# Patient Record
Sex: Male | Born: 1975 | Race: Black or African American | Hispanic: No | Marital: Single | Smoking: Current every day smoker
Health system: Southern US, Community
[De-identification: ages and names within clinical notes are randomized; demographics above are authoritative.]

## PROBLEM LIST (undated history)

## (undated) DIAGNOSIS — F1910 Other psychoactive substance abuse, uncomplicated: Secondary | ICD-10-CM

---

## 2002-01-17 ENCOUNTER — Emergency Department (HOSPITAL_COMMUNITY): Admission: EM | Admit: 2002-01-17 | Discharge: 2002-01-17 | Payer: Self-pay | Admitting: Emergency Medicine

## 2002-02-19 ENCOUNTER — Emergency Department (HOSPITAL_COMMUNITY): Admission: EM | Admit: 2002-02-19 | Discharge: 2002-02-19 | Payer: Self-pay | Admitting: *Deleted

## 2004-01-10 ENCOUNTER — Emergency Department (HOSPITAL_COMMUNITY): Admission: EM | Admit: 2004-01-10 | Discharge: 2004-01-10 | Payer: Self-pay | Admitting: Emergency Medicine

## 2004-02-05 ENCOUNTER — Emergency Department (HOSPITAL_COMMUNITY): Admission: EM | Admit: 2004-02-05 | Discharge: 2004-02-06 | Payer: Self-pay | Admitting: Emergency Medicine

## 2004-02-29 ENCOUNTER — Ambulatory Visit: Payer: Self-pay | Admitting: Internal Medicine

## 2004-02-29 ENCOUNTER — Inpatient Hospital Stay (HOSPITAL_COMMUNITY): Admission: EM | Admit: 2004-02-29 | Discharge: 2004-03-03 | Payer: Self-pay | Admitting: Emergency Medicine

## 2004-03-10 ENCOUNTER — Ambulatory Visit: Payer: Self-pay | Admitting: Internal Medicine

## 2018-07-11 ENCOUNTER — Encounter: Payer: Self-pay | Admitting: Critical Care Medicine

## 2018-07-12 ENCOUNTER — Encounter (INDEPENDENT_AMBULATORY_CARE_PROVIDER_SITE_OTHER): Payer: Self-pay | Admitting: Critical Care Medicine

## 2018-07-12 NOTE — Progress Notes (Signed)
42 [facility]  Notes 24hrs of chills, aches, no fever, head and throat pain.  Sl cough and sneeze.  Mucus is sl green.  Notes mild sinus congestion.   No flu shot.  Smoker.  Last saw MA Placey 3 mo ago.  NKDA.  No reg meds  No Dx   Px: Bp 130/82  T99.7   spo2 96%  Gen:  well-nourished, in no distress,  Flat  affect  ENT: No lesions,  mouth clear,  oropharynx clear, 1_+ postnasal drip, mild post pharyngeal inflammation  Neck: No JVD, no TMG, no carotid bruits  Lungs: No use of accessory muscles, no dullness to percussion, clear without rales or rhonchi  Cardiovascular: RRR, heart sounds normal, no murmur or gallops, no peripheral edema  Abdomen: soft and NT, no HSM,  BS normal  Musculoskeletal: No deformities, no cyanosis or clubbing  Neuro: alert, non focal  Skin: Warm, no lesions or rashes  No results found.   Imp  1. Doubt influenza.  Suspect Viral bronchitis.  No ABX or tamiflu needed.  Rx ibuprofen 400mg  qid prn symptoms  [facility] or [facility]u.

## 2019-07-12 ENCOUNTER — Other Ambulatory Visit: Payer: Self-pay | Admitting: *Deleted

## 2019-07-12 DIAGNOSIS — Z20822 Contact with and (suspected) exposure to covid-19: Secondary | ICD-10-CM

## 2019-07-14 LAB — NOVEL CORONAVIRUS, NAA: SARS-CoV-2, NAA: NOT DETECTED

## 2019-08-02 ENCOUNTER — Other Ambulatory Visit: Payer: Self-pay

## 2019-08-02 DIAGNOSIS — Z20822 Contact with and (suspected) exposure to covid-19: Secondary | ICD-10-CM

## 2019-08-03 LAB — NOVEL CORONAVIRUS, NAA: SARS-CoV-2, NAA: NOT DETECTED

## 2019-09-06 ENCOUNTER — Other Ambulatory Visit: Payer: Self-pay | Admitting: *Deleted

## 2019-09-06 DIAGNOSIS — Z20822 Contact with and (suspected) exposure to covid-19: Secondary | ICD-10-CM

## 2019-09-07 LAB — NOVEL CORONAVIRUS, NAA: SARS-CoV-2, NAA: NOT DETECTED

## 2019-12-07 ENCOUNTER — Observation Stay (HOSPITAL_COMMUNITY)
Admission: EM | Admit: 2019-12-07 | Discharge: 2019-12-08 | Disposition: A | Discharge status: Home / Self Care | Payer: Self-pay | Attending: Internal Medicine | Admitting: Internal Medicine

## 2019-12-07 ENCOUNTER — Emergency Department (HOSPITAL_COMMUNITY): Payer: Self-pay

## 2019-12-07 DIAGNOSIS — R490 Dysphonia: Secondary | ICD-10-CM | POA: Insufficient documentation

## 2019-12-07 DIAGNOSIS — T405X1A Poisoning by cocaine, accidental (unintentional), initial encounter: Principal | ICD-10-CM | POA: Insufficient documentation

## 2019-12-07 DIAGNOSIS — F1210 Cannabis abuse, uncomplicated: Secondary | ICD-10-CM | POA: Insufficient documentation

## 2019-12-07 DIAGNOSIS — J9690 Respiratory failure, unspecified, unspecified whether with hypoxia or hypercapnia: Secondary | ICD-10-CM

## 2019-12-07 DIAGNOSIS — G92 Toxic encephalopathy: Secondary | ICD-10-CM | POA: Insufficient documentation

## 2019-12-07 DIAGNOSIS — J690 Pneumonitis due to inhalation of food and vomit: Secondary | ICD-10-CM | POA: Diagnosis present

## 2019-12-07 DIAGNOSIS — T401X1A Poisoning by heroin, accidental (unintentional), initial encounter: Secondary | ICD-10-CM

## 2019-12-07 DIAGNOSIS — Z20822 Contact with and (suspected) exposure to covid-19: Secondary | ICD-10-CM | POA: Insufficient documentation

## 2019-12-07 DIAGNOSIS — T40601A Poisoning by unspecified narcotics, accidental (unintentional), initial encounter: Secondary | ICD-10-CM | POA: Insufficient documentation

## 2019-12-07 DIAGNOSIS — Z87891 Personal history of nicotine dependence: Secondary | ICD-10-CM | POA: Insufficient documentation

## 2019-12-07 DIAGNOSIS — F1410 Cocaine abuse, uncomplicated: Secondary | ICD-10-CM

## 2019-12-07 DIAGNOSIS — J9601 Acute respiratory failure with hypoxia: Secondary | ICD-10-CM | POA: Insufficient documentation

## 2019-12-07 DIAGNOSIS — Z720 Tobacco use: Secondary | ICD-10-CM

## 2019-12-07 DIAGNOSIS — Z66 Do not resuscitate: Secondary | ICD-10-CM | POA: Insufficient documentation

## 2019-12-07 DIAGNOSIS — F1010 Alcohol abuse, uncomplicated: Secondary | ICD-10-CM

## 2019-12-07 HISTORY — DX: Other psychoactive substance abuse, uncomplicated: F19.10

## 2019-12-07 LAB — CBC WITH DIFFERENTIAL/PLATELET
Abs Immature Granulocytes: 0.02 10*3/uL (ref 0.00–0.07)
Basophils Absolute: 0.1 10*3/uL (ref 0.0–0.1)
Basophils Relative: 1 %
Eosinophils Absolute: 0 10*3/uL (ref 0.0–0.5)
Eosinophils Relative: 0 %
HCT: 52.7 % — ABNORMAL HIGH (ref 39.0–52.0)
Hemoglobin: 17 g/dL (ref 13.0–17.0)
Immature Granulocytes: 0 %
Lymphocytes Relative: 12 %
Lymphs Abs: 1.5 10*3/uL (ref 0.7–4.0)
MCH: 33.3 pg (ref 26.0–34.0)
MCHC: 32.3 g/dL (ref 30.0–36.0)
MCV: 103.3 fL — ABNORMAL HIGH (ref 80.0–100.0)
Monocytes Absolute: 0.4 10*3/uL (ref 0.1–1.0)
Monocytes Relative: 3 %
Neutro Abs: 10.5 10*3/uL — ABNORMAL HIGH (ref 1.7–7.7)
Neutrophils Relative %: 84 %
Platelets: 220 10*3/uL (ref 150–400)
RBC: 5.1 MIL/uL (ref 4.22–5.81)
RDW: 11.9 % (ref 11.5–15.5)
WBC: 12.5 10*3/uL — ABNORMAL HIGH (ref 4.0–10.5)
nRBC: 0 % (ref 0.0–0.2)

## 2019-12-07 LAB — BASIC METABOLIC PANEL
Anion gap: 15 (ref 5–15)
BUN: 7 mg/dL (ref 6–20)
CO2: 18 mmol/L — ABNORMAL LOW (ref 22–32)
Calcium: 8.6 mg/dL — ABNORMAL LOW (ref 8.9–10.3)
Chloride: 108 mmol/L (ref 98–111)
Creatinine, Ser: 0.88 mg/dL (ref 0.61–1.24)
GFR calc Af Amer: >60 mL/min (ref >60)
GFR calc non Af Amer: >60 mL/min (ref >60)
Glucose, Bld: 106 mg/dL — ABNORMAL HIGH (ref 70–99)
Potassium: 3.6 mmol/L (ref 3.5–5.1)
Sodium: 141 mmol/L (ref 135–145)

## 2019-12-07 LAB — SARS CORONAVIRUS 2 BY RT PCR (HOSPITAL ORDER, PERFORMED IN ~~LOC~~ HOSPITAL LAB): SARS Coronavirus 2: NEGATIVE

## 2019-12-07 MED ORDER — FOLIC ACID 1 MG PO TABS: 1.0000 mg | ORAL_TABLET | Freq: Every day | ORAL | Status: DC

## 2019-12-07 MED ORDER — SODIUM CHLORIDE 0.9 % IV BOLUS
1000.0000 mL | Freq: Once | INTRAVENOUS | Status: AC
  Administered 2019-12-07: 1000 mL via INTRAVENOUS

## 2019-12-07 MED ORDER — NALOXONE HCL 0.4 MG/ML IJ SOLN
INTRAMUSCULAR | Status: AC
  Filled 2019-12-07: qty 1

## 2019-12-07 MED ORDER — ENOXAPARIN SODIUM 40 MG/0.4ML ~~LOC~~ SOLN
40.0000 mg | Freq: Every day | SUBCUTANEOUS | Status: DC
  Administered 2019-12-08: 40 mg via SUBCUTANEOUS
  Filled 2019-12-07: qty 0.4

## 2019-12-07 MED ORDER — ONDANSETRON HCL 4 MG/2ML IJ SOLN: 4.0000 mg | Freq: Three times a day (TID) | INTRAMUSCULAR | Status: DC | PRN

## 2019-12-07 MED ORDER — AMOXICILLIN-POT CLAVULANATE 875-125 MG PO TABS: 1.0000 | ORAL_TABLET | Freq: Two times a day (BID) | ORAL | 0 refills | Status: DC

## 2019-12-07 MED ORDER — SODIUM CHLORIDE 0.9 % IV SOLN
3.0000 g | Freq: Four times a day (QID) | INTRAVENOUS | Status: DC
  Administered 2019-12-08 (×2): 3 g via INTRAVENOUS
  Filled 2019-12-07 (×2): qty 3
  Filled 2019-12-07 (×2): qty 8

## 2019-12-07 MED ORDER — THIAMINE HCL 100 MG PO TABS: 100.0000 mg | ORAL_TABLET | Freq: Every day | ORAL | Status: DC

## 2019-12-07 MED ORDER — THIAMINE HCL 100 MG/ML IJ SOLN
Freq: Once | INTRAVENOUS | Status: AC
  Filled 2019-12-07: qty 1000

## 2019-12-07 MED ORDER — ONDANSETRON HCL 4 MG/2ML IJ SOLN
4.0000 mg | Freq: Once | INTRAMUSCULAR | Status: AC
  Administered 2019-12-07: 4 mg via INTRAVENOUS
  Filled 2019-12-07: qty 2

## 2019-12-07 MED ORDER — SODIUM CHLORIDE 0.9 % IV SOLN
3.0000 g | Freq: Once | INTRAVENOUS | Status: AC
  Administered 2019-12-07: 3 g via INTRAVENOUS
  Filled 2019-12-07: qty 8

## 2019-12-07 MED ORDER — AMOXICILLIN-POT CLAVULANATE 875-125 MG PO TABS
1.0000 | ORAL_TABLET | Freq: Once | ORAL | Status: DC
  Filled 2019-12-07: qty 1

## 2019-12-07 NOTE — H&P (Signed)
History and Physical    Juan Hood RCV:[id number] DOB: 07-27-1975 DOA: 12/07/2019  PCP: Patient, No Pcp Per  Patient coming from: [facility] ED (he was found down in the grass near a parking lot)  I have personally briefly reviewed patient's old medical records in [facility]  Chief Complaint: Found down in the grass near a parking lot  HPI: Juan Hood is a 44 y.o. male with medical history significant of polysubstance abuse was brought to the ED by EMS.  He was found down in the grass near parking lot.  Per documentation he admitted to snorting heroin and was found to be diaphoretic with pinpoint pupils and agonal respirations.  His mentation improved with 1 mg intranasal Narcan.  While in the ED, he was placed on 4 L oxygen via nasal cannula and while ambulating his oxygen saturations dropped requiring 6 L oxygen via nasal cannula per ED physician.  Chest x-ray showed mild right middle lobe haziness.  He received 1 dose of Unasyn for presumed aspiration pneumonia and hospitalist called for admission.  On my interview, patient does not recollect events that brought him to the hospital.  He does complain of generalized weakness and lower back pain.  He noted his voice has changed when asked.  Denies snorting cocaine or heroin.  Smokes weed regularly.  Endorses using alcohol daily but is unable to quantify.  If he does not drink, he gets tremors.  He denies any history of withdrawal seizures or delirium tremens.  Review of Systems: Unable to obtain accurate review of systems as the patient does not recollect events that brought him to the hospital.    Past Medical History:  Diagnosis Date  . Substance abuse (HCC)     History reviewed. No pertinent surgical history.   reports that he has been smoking cigarettes. He has never used smokeless tobacco. He reports current alcohol use. He reports current drug use. Drugs: Cocaine and Marijuana.  No Known Allergies  Family History  Family  history unknown: Yes     Prior to Admission medications   Medication Sig Start Date End Date Taking? Authorizing Provider  amoxicillin-clavulanate (AUGMENTIN) 875-125 MG tablet Take 1 tablet by mouth every 12 (twelve) hours. 12/07/19   Terald Sleeper, MD    Physical Exam: Vitals:   12/07/19 2330 12/08/19 0016 12/08/19 0030 12/08/19 0107  BP: 129/86 132/81 127/84 139/83  Pulse: 66 65 61 66  Resp:  18 15 17   Temp:    98.5 F (36.9 C)  TempSrc:      SpO2: 97% 99% 93% 95%    Constitutional: NAD, calm, comfortable Vitals:   12/07/19 2330 12/08/19 0016 12/08/19 0030 12/08/19 0107  BP: 129/86 132/81 127/84 139/83  Pulse: 66 65 61 66  Resp:  18 15 17   Temp:    98.5 F (36.9 C)  TempSrc:      SpO2: 97% 99% 93% 95%   Eyes: PERRL, lids and conjunctivae normal ENMT: Mucous membranes are dry.  Unable to visualize posterior oropharynx.  No thrush in oral cavity. Neck: normal, supple, no masses, no thyromegaly Respiratory: clear to auscultation bilaterally, no wheezing, no crackles. Normal respiratory effort. No accessory muscle use.  Cardiovascular: Regular rate and rhythm, no murmurs. No extremity edema. 2+ pedal pulses.  Abdomen: no tenderness, no masses palpated. No hepatosplenomegaly. Bowel sounds positive.  Musculoskeletal: no clubbing / cyanosis. No joint deformity upper and lower extremities.  Skin: no rashes, lesions, ulcers. No induration Neurologic: CN 2-12  grossly intact. Strength 5/5 in all 4.  Psychiatric: Oriented to self, place, date of birth.  States it is a 2020, July but when corrected and asked again later he acknowledges it is June 2021.  Flat affect.  Labs on Admission: I have personally reviewed following labs and imaging studies  CBC: Recent Labs  Lab 12/07/19 2024  WBC 12.5*  NEUTROABS 10.5*  HGB 17.0  HCT 52.7*  MCV 103.3*  PLT 220   Basic Metabolic Panel: Recent Labs  Lab 12/07/19 2024  NA 141  K 3.6  CL 108  CO2 18*  GLUCOSE 106*  BUN 7    CREATININE 0.88  CALCIUM 8.6*   GFR: CrCl cannot be calculated (Unknown ideal weight.). Liver Function Tests: No results for input(s): AST, ALT, ALKPHOS, BILITOT, PROT, ALBUMIN in the last 168 hours. No results for input(s): LIPASE, AMYLASE in the last 168 hours. No results for input(s): AMMONIA in the last 168 hours. Coagulation Profile: No results for input(s): INR, PROTIME in the last 168 hours. Cardiac Enzymes: Recent Labs  Lab 12/08/19 0035  CKTOTAL 233   BNP (last 3 results) No results for input(s): PROBNP in the last 8760 hours. HbA1C: No results for input(s): HGBA1C in the last 72 hours. CBG: No results for input(s): GLUCAP in the last 168 hours. Lipid Profile: No results for input(s): CHOL, HDL, LDLCALC, TRIG, CHOLHDL, LDLDIRECT in the last 72 hours. Thyroid Function Tests: No results for input(s): TSH, T4TOTAL, FREET4, T3FREE, THYROIDAB in the last 72 hours. Anemia Panel: No results for input(s): VITAMINB12, FOLATE, FERRITIN, TIBC, IRON, RETICCTPCT in the last 72 hours. Urine analysis: No results found for: COLORURINE, APPEARANCEUR, LABSPEC, PHURINE, GLUCOSEU, HGBUR, BILIRUBINUR, KETONESUR, PROTEINUR, UROBILINOGEN, NITRITE, LEUKOCYTESUR  Radiological Exams on Admission: DG Chest 1 View  Result Date: 12/07/2019 CLINICAL DATA:  Overdose, hypoxia, evaluate for aspiration EXAM: CHEST  1 VIEW COMPARISON:  None. FINDINGS: The heart size and mediastinal contours are within normal limits. Subtle, diffuse interstitial opacity, particularly in the right lung base. The visualized skeletal structures are unremarkable. IMPRESSION: Subtle, diffuse interstitial opacity, particularly in the right lung base, consistent with infection or edema. Electronically Signed   By: Lauralyn Primes M.D.   On: 12/07/2019 17:19    EKG: Independently reviewed.   Assessment/Plan Principal Problem:   Acute hypoxic Respiratory failure (HCC) Active Problems:   Cocaine abuse (HCC)   Alcohol abuse    Tobacco abuse  Acute hypoxic respiratory failure Requiring 4 L oxygen at rest, 6 L oxygen via nasal cannula on ambulation. Chest x-ray with questionable right middle lobe haziness Differentials include aspiration pneumonitis versus aspiration pneumonia versus cocaine/heroin induced lung injury We will continue Unasyn for now to cover for aspiration pneumonia given unclear history Wean oxygen as tolerated-wean to 2 L oxygen via [place]  Polysubstance abuse UDS positive for cocaine, tetrahydrocannabinol UDS negative for opioids.  Per documentation patient initially admitted to using heroin and initial altered mentation improved with Narcan. Substance abuse counseling.  He may benefit from a psych evaluation as he may have underlying mental health issues contributing to polysubstance abuse He will likely need social work assistance  Alcohol abuse Reports daily use but cannot quantify We will give 1 L IV banana bag and start on multivitamins CIWA protocol  Hoarseness of voice Unable to visualize oropharynx Etiology unclear, ?Related to street drugs, ?Infection/ STD (reports he is sexually active, uses barrier protection) Patient coughed while taking sips of water We will have speech evaluate for swallow function, ?  Hoarseness of voice May need further evaluation if symptoms do not resolve  DVT prophylaxis: Lovenox SQ Code Status: DNR (discussed with patient, he understands DO NOT RESUSCITATE means CPR will not be performed, shock will not be given and he will not be placed on a ventilator if his heart or breathing stops) Family Communication: Patient denies having any family Disposition Plan: Discharge once medically stable Consults called: None Admission status: Observation   Lucky Cowboy MD [facility]  If 7PM-7AM, please contact night-coverage 12/08/2019, 1:30 AM

## 2019-12-07 NOTE — Discharge Instructions (Addendum)
Follow with Primary MD  in 7 days   Get CBC, CMP, 2 view Chest X ray -  checked next visit within 1 week by Primary MD    Activity: As tolerated with Full fall precautions use walker/cane & assistance as needed  Disposition Home   Diet: Heart Healthy    Special Instructions: If you have smoked or chewed Tobacco  in the last 2 yrs please stop smoking, stop any regular Alcohol  and or any Recreational drug use.  On your next visit with your primary care physician please Get Medicines reviewed and adjusted.  Please request your Prim.MD to go over all Hospital Tests and Procedure/Radiological results at the follow up, please get all Hospital records sent to your Prim MD by signing hospital release before you go home.  If you experience worsening of your admission symptoms, develop shortness of breath, life threatening emergency, suicidal or homicidal thoughts you must seek medical attention immediately by calling 911 or calling your MD immediately  if symptoms less severe.  You Must read complete instructions/literature along with all the possible adverse reactions/side effects for all the Medicines you take and that have been prescribed to you. Take any new Medicines after you have completely understood and accpet all the possible adverse reactions/side effects.   Do not drive, operate heavy machinery, perform activities at heights, swimming or participation in water activities or provide baby sitting services if your were admitted for syncope or siezures until you have seen by Primary MD or a Neurologist and advised to do so again.  Do not drive when taking Pain medications.  Do not take more than prescribed Pain, Sleep and Anxiety Medications  Wear Seat belts while driving.    

## 2019-12-07 NOTE — ED Notes (Signed)
Pt titrated down to 2L [place]. SpO2 at 97%. Will continue to [place]

## 2019-12-07 NOTE — ED Notes (Signed)
The pt remains in the hallway difficult to keep his vital signs

## 2019-12-07 NOTE — ED Triage Notes (Signed)
Pt arrived via [facility] found down in the grass near parking lot. Pt was diaphretic, pin point pupils, aganol resps. Pt arrived with c-collar on. EMS gave narcan 1mg  intranasal. Pt lethargic. Pt admitted to [facility] he snorted heroin.

## 2019-12-07 NOTE — Progress Notes (Signed)
Pharmacy Antibiotic Note  Juan Hood is a 44 y.o. male admitted on 12/07/2019 with heroin overdose >> concern for aspiration pneumonia.  Pharmacy has been consulted for Unasyn dosing.  Plan: Unasyn 3g IV Q6H.  Temp (24hrs), Avg:97.6 F (36.4 C), Min:97.6 F (36.4 C), Max:97.6 F (36.4 C)  Recent Labs  Lab 12/07/19 2024  WBC 12.5*  CREATININE 0.88     No Known Allergies   Thank you for allowing pharmacy to be a part of this patient's care.  Vernard Gambles, PharmD, BCPS  12/07/2019 11:26 PM

## 2019-12-07 NOTE — ED Provider Notes (Signed)
[facility] EMERGENCY DEPARTMENT Provider Note   CSN: [id number] Arrival date & time: 12/07/19  1503     History CC: Heroin overdose  Juan Hood is a 44 y.o. male presenting to ED with heroin overdose.  Pt witnessed to snort heroin, found down with hypopnea RR 4-6 per EMS, pinpoint pupils, given IN narcan with improvement of respiration.  On arrival in the Ed the patient is somnolent but does awaken to voice, confirms he snorted heroin, says he feels terrible, but has no focal complaints.  He arrives in C-spine collar but there is not report of trauma or head injury or altercation.  HPI     No past medical history on file.  Patient Active Problem List   Diagnosis Date Noted  . Aspiration pneumonia (HCC) 12/07/2019     No family history on file.  Social History   Tobacco Use  . Smoking status: Not on file  Substance Use Topics  . Alcohol use: Not on file  . Drug use: Not on file    Home Medications Prior to Admission medications   Medication Sig Start Date End Date Taking? Authorizing Provider  amoxicillin-clavulanate (AUGMENTIN) 875-125 MG tablet Take 1 tablet by mouth every 12 (twelve) hours. 12/07/19   Terald Sleeper, MD    Allergies    Patient has no known allergies.  Review of Systems   Review of Systems  Unable to perform ROS: Mental status change (level 5 caveat)    Physical Exam Updated Vital Signs BP 129/86   Pulse 66   Temp 97.6 F (36.4 C) (Oral)   Resp 17   SpO2 97%   Physical Exam Vitals and nursing note reviewed.  Constitutional:      Appearance: He is well-developed.     Comments: Somnolent, awakens to voice, occasional spasm or clonus  HENT:     Head: Normocephalic and atraumatic.  Eyes:     Conjunctiva/sclera: Conjunctivae normal.     Comments: Pupils mid-size  Cardiovascular:     Rate and Rhythm: Normal rate and regular rhythm.     Pulses: Normal pulses.  Pulmonary:     Effort: Pulmonary effort is  normal. No respiratory distress.     Comments: 94% on room air, RR 10 Abdominal:     Palpations: Abdomen is soft.     Tenderness: There is no abdominal tenderness.  Musculoskeletal:     Cervical back: Neck supple.  Skin:    General: Skin is warm and dry.  Neurological:     General: No focal deficit present.     Mental Status: He is alert and oriented to person, place, and time.  Psychiatric:        Mood and Affect: Mood normal.        Behavior: Behavior normal.     ED Results / Procedures / Treatments   Labs (all labs ordered are listed, but only abnormal results are displayed) Labs Reviewed  BASIC METABOLIC PANEL - Abnormal; Notable for the following components:      Result Value   CO2 18 (*)    Glucose, Bld 106 (*)    Calcium 8.6 (*)    All other components within normal limits  CBC WITH DIFFERENTIAL/PLATELET - Abnormal; Notable for the following components:   WBC 12.5 (*)    HCT 52.7 (*)    MCV 103.3 (*)    Neutro Abs 10.5 (*)    All other components within normal limits  SARS CORONAVIRUS 2  BY RT PCR (HOSPITAL ORDER, PERFORMED IN [facility])  RAPID URINE DRUG SCREEN, HOSP PERFORMED  HIV ANTIBODY (ROUTINE TESTING W REFLEX)  CK    EKG None  Radiology DG Chest 1 View  Result Date: 12/07/2019 CLINICAL DATA:  Overdose, hypoxia, evaluate for aspiration EXAM: CHEST  1 VIEW COMPARISON:  None. FINDINGS: The heart size and mediastinal contours are within normal limits. Subtle, diffuse interstitial opacity, particularly in the right lung base. The visualized skeletal structures are unremarkable. IMPRESSION: Subtle, diffuse interstitial opacity, particularly in the right lung base, consistent with infection or edema. Electronically Signed   By: Lauralyn Primes M.D.   On: 12/07/2019 17:19    Procedures Procedures (including critical care time)  Medications Ordered in ED Medications  naloxone (NARCAN) 0.4 MG/ML injection (has no administration in time range)    enoxaparin (LOVENOX) injection 40 mg (has no administration in time range)  sodium chloride 0.9 % 1,000 mL with thiamine 100 mg, folic acid 1 mg, multivitamins adult 10 mL infusion (has no administration in time range)  ondansetron (ZOFRAN) injection 4 mg (has no administration in time range)  folic acid (FOLVITE) tablet 1 mg (has no administration in time range)  thiamine tablet 100 mg (has no administration in time range)  Ampicillin-Sulbactam (UNASYN) 3 g in sodium chloride 0.9 % 100 mL IVPB (has no administration in time range)  ondansetron (ZOFRAN) injection 4 mg (4 mg Intravenous Given 12/07/19 1950)  Ampicillin-Sulbactam (UNASYN) 3 g in sodium chloride 0.9 % 100 mL IVPB (0 g Intravenous Stopped 12/07/19 2133)  sodium chloride 0.9 % bolus 1,000 mL (0 mLs Intravenous Stopped 12/07/19 2217)    ED Course  I have reviewed the triage vital signs and the nursing notes.  Pertinent labs & imaging results that were available during my care of the patient were reviewed by me and considered in my medical decision making (see chart for details).  44 year old male presenting after being found down with reported heroin overdose.  The patient admits to using heroin.  EMS provided Narcan in the field, reporting that the patient was initially hypoxic and breathing 4 times per minute.  On arrival the patient appeared to initially be hypoxic with an O2 sat of 84% on room air, requiring 4 to 5 L of nasal cannula.  He was also breathing slow.  After placing his head in sniffing position, his oxygenation improved.  He was easily arousable and his oxygen appeared to improve when he was awake.  However found even after the heroin and worn off and the patient was awake, conversational, and attempted to ambulate, he remained hypoxic as low as 84%.  He continued to require 6 L nasal cannula felt short of breath.  An x-ray of the chest showed possible aspiration in the right lower lobe, consistent with his history.  I  therefore felt the patient would need to be admitted for an aspiration pneumonia.  I discussed this with the patient and he agrees.  Ordered IV unasyn in the ED.  COVID screening ordered as well.  Clinical Course as of Dec 07 9  Sat Dec 07, 2019  1632 95% O2 sat, sleeping, still arousable   [MT]  1916 Upon attempting to ambulate the patient for discharge, is noted to become hypoxic into the low to mid 80s on room air.  We sent a back down on the bed.  He feels short of breath and lethargic.  I am concerned he may have aspirated from his x-ray  of the chest.  He is requiring 6 L nasal cannula.  Plan to draw labs, admit for aspiration PNA, give IV unasyn.    [MT]  2253 Admitted to hospitalist   [MT]    Clinical Course User Index [MT] Jenni Thew, Carola Rhine, MD    Final Clinical Impression(s) / ED Diagnoses Final diagnoses:  Accidental overdose of heroin, initial encounter [facility])    Rx / DC Orders ED Discharge Orders         Ordered    amoxicillin-clavulanate (AUGMENTIN) 875-125 MG tablet  Every 12 hours     Discontinue  Reprint     12/07/19 1921           Wyvonnia Dusky, MD 12/08/19 0011

## 2019-12-07 NOTE — ED Notes (Signed)
The pt was taken to xray for a chest  02 sat on mask 02  97%

## 2019-12-07 NOTE — ED Notes (Signed)
The pt is on 60% non-rebreather  The pt returned from xray  02 tank empty  Nasal cannula instaed of 60 %  02  sats were 84%  60% 02 replaced sats recovered to 100%  Pt awakened momentarily then went back to sleep

## 2019-12-07 NOTE — ED Notes (Signed)
Per Md Patel pt allowed sips of water. Pt given 3 small sips. On third sip pt began coughing.

## 2019-12-08 ENCOUNTER — Encounter (HOSPITAL_COMMUNITY): Payer: Self-pay | Admitting: Internal Medicine

## 2019-12-08 DIAGNOSIS — F1410 Cocaine abuse, uncomplicated: Secondary | ICD-10-CM

## 2019-12-08 DIAGNOSIS — F1010 Alcohol abuse, uncomplicated: Secondary | ICD-10-CM

## 2019-12-08 DIAGNOSIS — J9690 Respiratory failure, unspecified, unspecified whether with hypoxia or hypercapnia: Secondary | ICD-10-CM

## 2019-12-08 DIAGNOSIS — Z720 Tobacco use: Secondary | ICD-10-CM

## 2019-12-08 DIAGNOSIS — T401X1A Poisoning by heroin, accidental (unintentional), initial encounter: Secondary | ICD-10-CM

## 2019-12-08 LAB — BASIC METABOLIC PANEL
Anion gap: 10 (ref 5–15)
BUN: 8 mg/dL (ref 6–20)
CO2: 24 mmol/L (ref 22–32)
Calcium: 8.3 mg/dL — ABNORMAL LOW (ref 8.9–10.3)
Chloride: 103 mmol/L (ref 98–111)
Creatinine, Ser: 0.89 mg/dL (ref 0.61–1.24)
GFR calc Af Amer: >60 mL/min (ref >60)
GFR calc non Af Amer: >60 mL/min (ref >60)
Glucose, Bld: 105 mg/dL — ABNORMAL HIGH (ref 70–99)
Potassium: 4.2 mmol/L (ref 3.5–5.1)
Sodium: 137 mmol/L (ref 135–145)

## 2019-12-08 LAB — CBC
HCT: 42.1 % (ref 39.0–52.0)
Hemoglobin: 14.4 g/dL (ref 13.0–17.0)
MCH: 33 pg (ref 26.0–34.0)
MCHC: 34.2 g/dL (ref 30.0–36.0)
MCV: 96.3 fL (ref 80.0–100.0)
Platelets: 223 10*3/uL (ref 150–400)
RBC: 4.37 MIL/uL (ref 4.22–5.81)
RDW: 11.9 % (ref 11.5–15.5)
WBC: 13 10*3/uL — ABNORMAL HIGH (ref 4.0–10.5)
nRBC: 0 % (ref 0.0–0.2)

## 2019-12-08 LAB — RAPID URINE DRUG SCREEN, HOSP PERFORMED
Amphetamines: NOT DETECTED
Barbiturates: NOT DETECTED
Benzodiazepines: NOT DETECTED
Cocaine: POSITIVE — AB
Opiates: NOT DETECTED
Tetrahydrocannabinol: POSITIVE — AB

## 2019-12-08 LAB — CK: Total CK: 233 U/L (ref 49–397)

## 2019-12-08 LAB — HIV ANTIBODY (ROUTINE TESTING W REFLEX): HIV Screen 4th Generation wRfx: NONREACTIVE

## 2019-12-08 MED ORDER — FOLIC ACID 1 MG PO TABS: 1.0000 mg | ORAL_TABLET | Freq: Every day | ORAL | 0 refills | Status: DC

## 2019-12-08 MED ORDER — THIAMINE HCL 100 MG PO TABS: 100.0000 mg | ORAL_TABLET | Freq: Every day | ORAL | 0 refills | Status: DC

## 2019-12-08 MED ORDER — AMOXICILLIN-POT CLAVULANATE 875-125 MG PO TABS
1.0000 | ORAL_TABLET | Freq: Two times a day (BID) | ORAL | 0 refills | Status: DC
Start: 2019-12-08 — End: 2022-07-10

## 2019-12-08 NOTE — Progress Notes (Signed)
SLP Cancellation Note  Patient Details Name: Juan Hood MRN: [id number] DOB: [birth year]   Cancelled treatment:       Reason Eval/Treat Not Completed: Spoke with RN who stated that pt was already cleared for a soft solid and thin liquid diet by Dr. Thedore Mins.  She stated that she would reach out to Dr. Thedore Mins to cancel bedside swallow evaluation orders.     [name] 12/08/2019, 8:54 AM

## 2019-12-08 NOTE — Care Management (Signed)
Patient given [facility] letter with override for copay as patient is homeless and has no money.   Patient given cab voucher to get to pharmacy and bus pass to get home from pharmacy.
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
## 2019-12-08 NOTE — Progress Notes (Signed)
Patient discharged to home with instructions, prescriptions and bus pass. Transported via taxi cab.

## 2019-12-21 ENCOUNTER — Other Ambulatory Visit: Payer: Self-pay

## 2019-12-21 ENCOUNTER — Emergency Department (HOSPITAL_COMMUNITY)
Admission: EM | Admit: 2019-12-21 | Discharge: 2019-12-21 | Disposition: A | Discharge status: Home / Self Care | Payer: Self-pay | Attending: Emergency Medicine | Admitting: Emergency Medicine

## 2019-12-21 ENCOUNTER — Encounter (HOSPITAL_COMMUNITY): Payer: Self-pay

## 2019-12-21 ENCOUNTER — Emergency Department (HOSPITAL_COMMUNITY): Payer: Self-pay

## 2019-12-21 DIAGNOSIS — M62838 Other muscle spasm: Secondary | ICD-10-CM

## 2019-12-21 DIAGNOSIS — Y999 Unspecified external cause status: Secondary | ICD-10-CM | POA: Insufficient documentation

## 2019-12-21 DIAGNOSIS — R5383 Other fatigue: Secondary | ICD-10-CM | POA: Insufficient documentation

## 2019-12-21 DIAGNOSIS — Y9269 Other specified industrial and construction area as the place of occurrence of the external cause: Secondary | ICD-10-CM | POA: Insufficient documentation

## 2019-12-21 DIAGNOSIS — S0083XA Contusion of other part of head, initial encounter: Secondary | ICD-10-CM

## 2019-12-21 DIAGNOSIS — Y9389 Activity, other specified: Secondary | ICD-10-CM | POA: Insufficient documentation

## 2019-12-21 DIAGNOSIS — M545 Low back pain: Secondary | ICD-10-CM | POA: Insufficient documentation

## 2019-12-21 DIAGNOSIS — R4020 Unspecified coma: Secondary | ICD-10-CM

## 2019-12-21 DIAGNOSIS — F1290 Cannabis use, unspecified, uncomplicated: Secondary | ICD-10-CM | POA: Insufficient documentation

## 2019-12-21 DIAGNOSIS — F17210 Nicotine dependence, cigarettes, uncomplicated: Secondary | ICD-10-CM | POA: Insufficient documentation

## 2019-12-21 DIAGNOSIS — S060X1A Concussion with loss of consciousness of 30 minutes or less, initial encounter: Secondary | ICD-10-CM

## 2019-12-21 DIAGNOSIS — W228XXA Striking against or struck by other objects, initial encounter: Secondary | ICD-10-CM | POA: Insufficient documentation

## 2019-12-21 MED ORDER — METHOCARBAMOL 500 MG PO TABS
500.0000 mg | ORAL_TABLET | Freq: Two times a day (BID) | ORAL | 0 refills | Status: DC
Start: 2019-12-21 — End: 2022-07-10

## 2019-12-21 MED ORDER — ACETAMINOPHEN 500 MG PO TABS
1000.0000 mg | ORAL_TABLET | Freq: Once | ORAL | Status: AC
  Administered 2019-12-21: 1000 mg via ORAL
  Filled 2019-12-21: qty 2
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
## 2021-03-13 IMAGING — CT CT HEAD W/O CM
3 series · 14 of 47 positions shown, 16 images · non-contrast
Comparison: Head CT 02/05/2004

CLINICAL DATA: Head trauma 2 days ago with small laceration
posterior scalp and right-sided neck pain.

EXAM:
CT HEAD WITHOUT CONTRAST
CT CERVICAL SPINE WITHOUT CONTRAST
TECHNIQUE: Multidetector CT imaging of the head and cervical spine was
performed following the standard protocol without intravenous
contrast. Multiplanar CT image reconstructions of the cervical spine
were also generated.

[Series 3: head wo · axial · 0.47mm/px · z∈[-164,-39]mm · 8 of 30 slices shown, 10 images]
[im 3/30  brain]
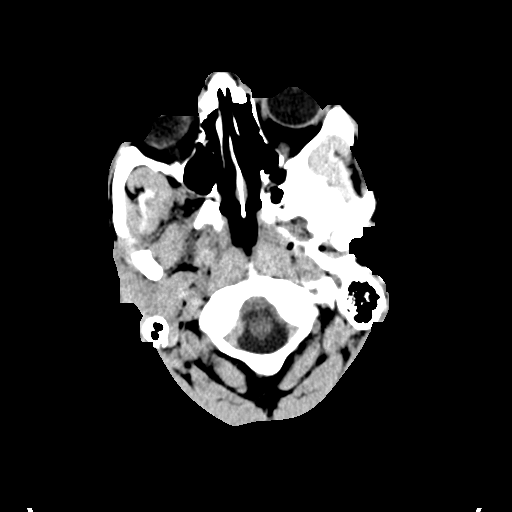
[im 3/30  bone]
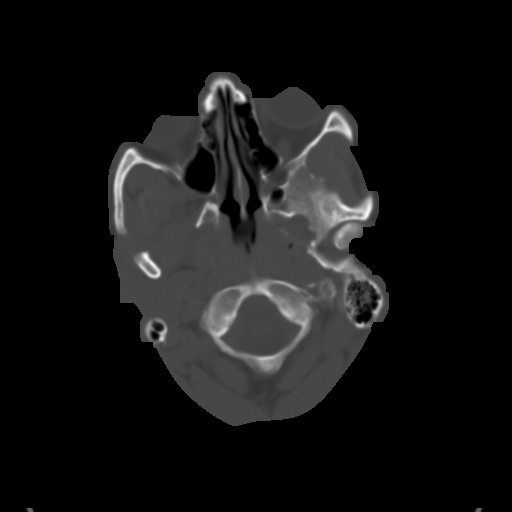
[im 7/30  brain]
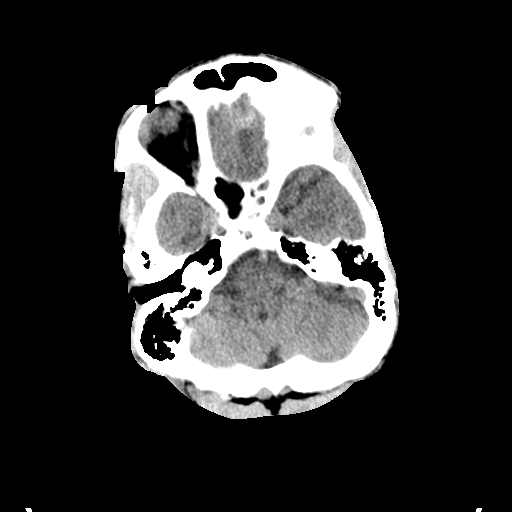
[im 10/30  brain]
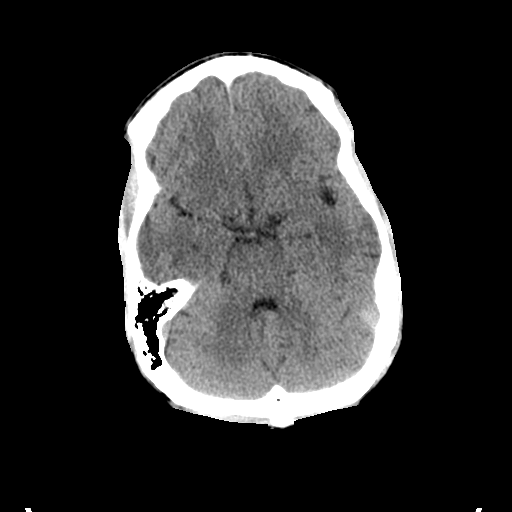
[im 14/30  brain]
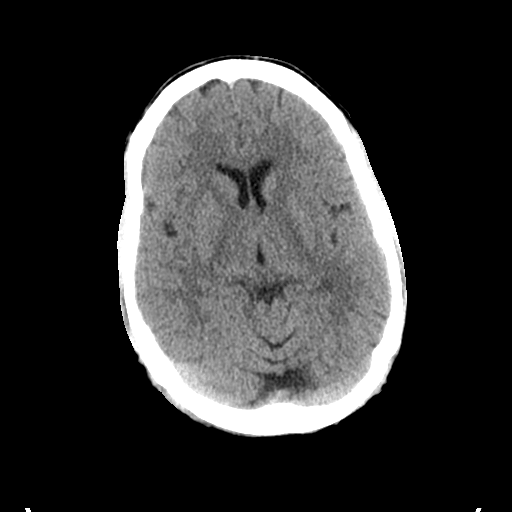
[im 17/30  brain]
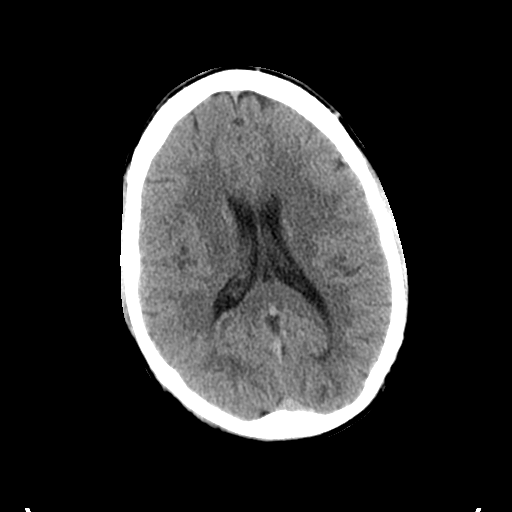
[im 17/30  bone]
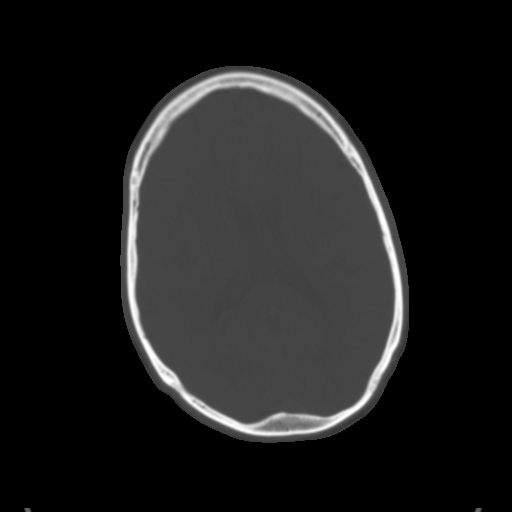
[im 21/30  brain]
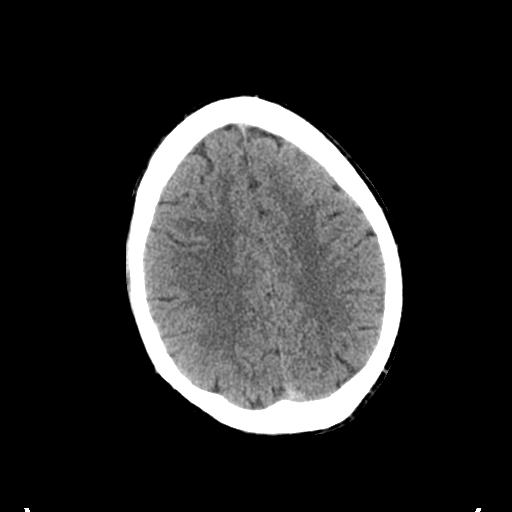
[im 24/30  brain]
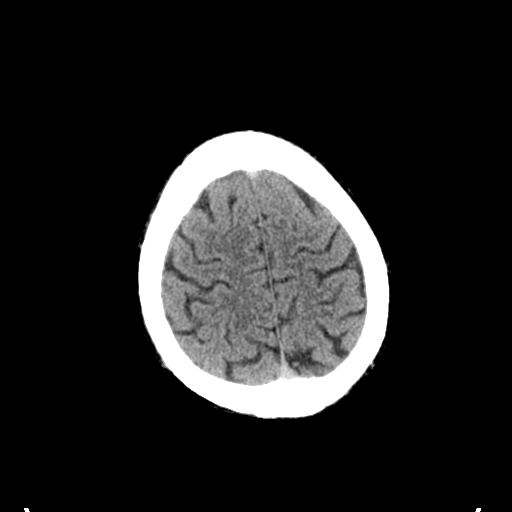
[im 28/30  brain]
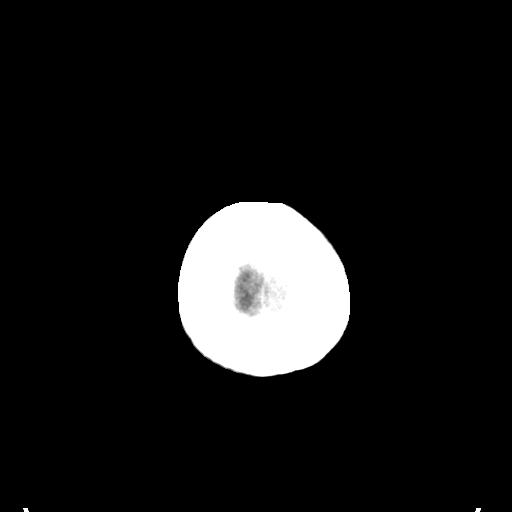

[Series 5: coronal soft tissue · coronal · 0.32mm/px · 3 of 62 slices shown]
[im 21/62  brain]
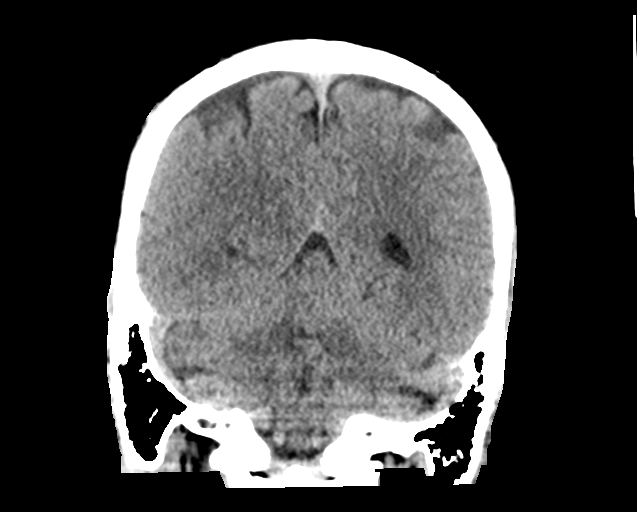
[im 28/62  brain]
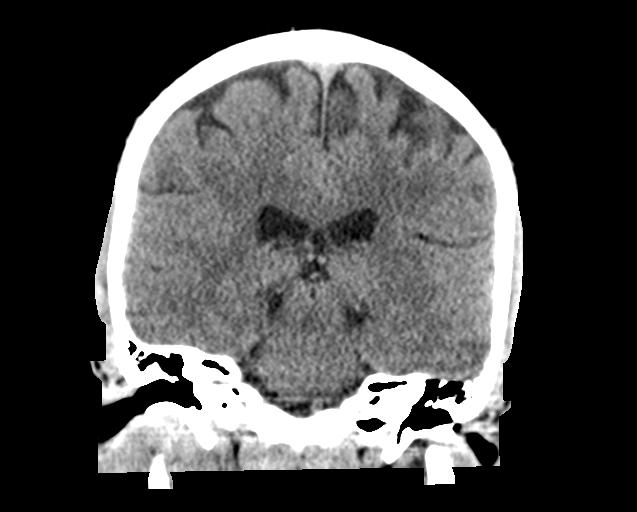
[im 34/62  brain]
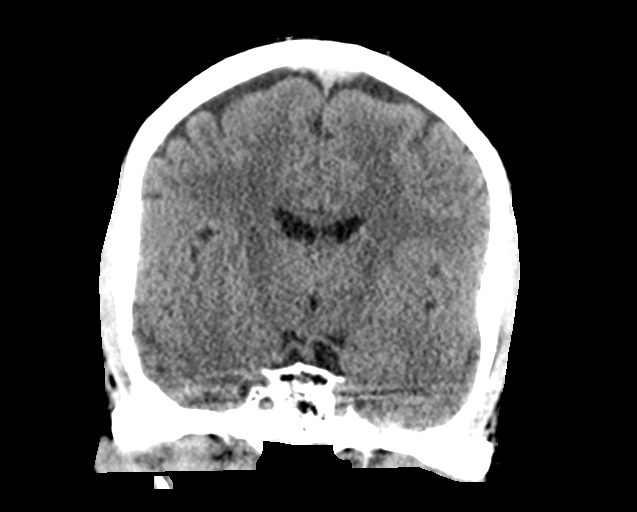

[Series 6: sagittal soft tissue · sagittal · 0.33mm/px · 3 of 47 slices shown]
[im 16/47  brain]
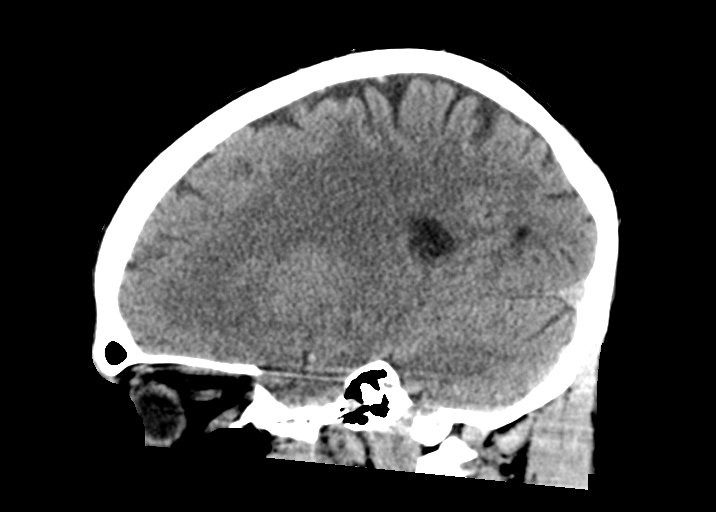
[im 24/47  brain]
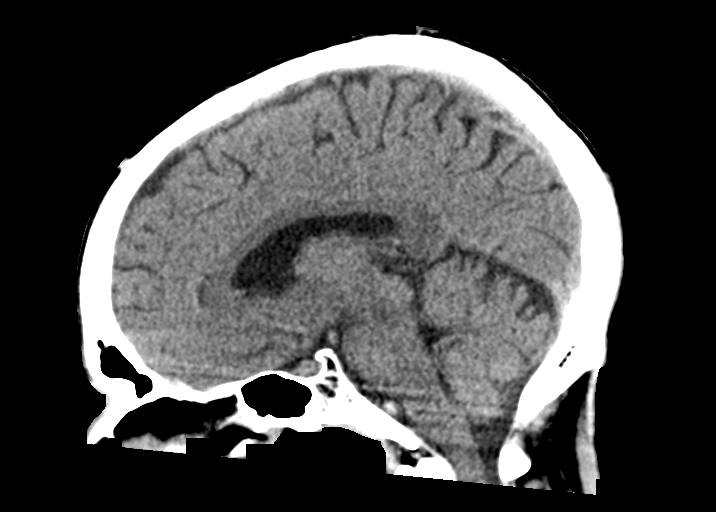
[im 31/47  brain]
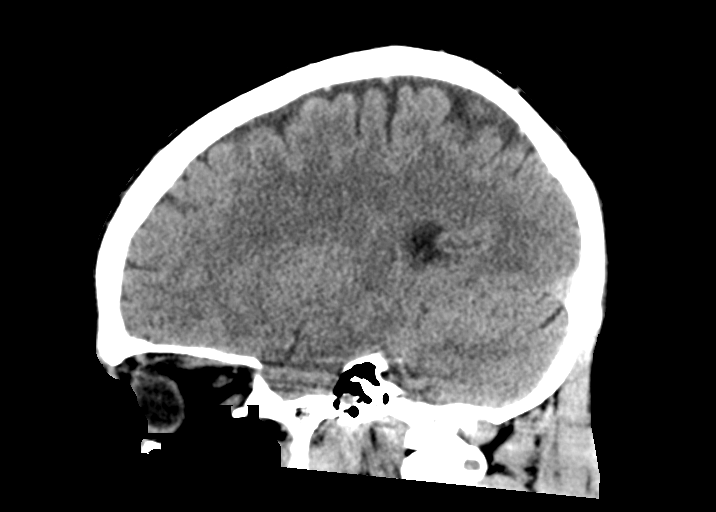

[14 of 47 positions shown; findings below may reference images not displayed]

FINDINGS: CT HEAD FINDINGS

Brain: Ventricles, cisterns and other CSF spaces are normal. There
is no mass, mass effect, shift of midline structures or acute
hemorrhage. No evidence of acute infarction.

Vascular: No hyperdense vessel or unexpected calcification.

Skull: Normal. Negative for fracture or focal lesion.

Sinuses/Orbits: No acute finding.

Other: None.

CT CERVICAL SPINE FINDINGS

Alignment: Mild straightening of the normal cervical lordosis.

Skull base and vertebrae: Minimal spondylosis of the cervical spine.
Atlantoaxial articulation is normal. No significant neural foraminal
narrowing. No acute fracture.

Soft tissues and spinal canal: No prevertebral fluid or swelling. No
visible canal hematoma.

Disc levels: Minimal disc space narrowing at the C6-7 level and to
lesser extent at the C5-6 level. No disc herniation.

Upper chest: Paraseptal emphysema over the lung apices.

Other: None.
IMPRESSION: 1.  Normal head CT.

2.  No acute cervical spine injury.

3. Mild spondylosis of the cervical spine with minimal disc disease
at the C6-7 level greater than the C5-6 level.

4.  Emphysema ([id number]).

## 2022-07-09 ENCOUNTER — Other Ambulatory Visit (HOSPITAL_COMMUNITY)
Admission: EM | Admit: 2022-07-09 | Discharge: 2022-07-14 | Disposition: A | Discharge status: Other Acute Inpatient Hospital | Payer: Medicaid Other | Attending: Psychiatry | Admitting: Psychiatry

## 2022-07-09 DIAGNOSIS — F1090 Alcohol use, unspecified, uncomplicated: Secondary | ICD-10-CM

## 2022-07-09 DIAGNOSIS — Z1152 Encounter for screening for COVID-19: Secondary | ICD-10-CM | POA: Diagnosis not present

## 2022-07-09 DIAGNOSIS — F1410 Cocaine abuse, uncomplicated: Secondary | ICD-10-CM | POA: Insufficient documentation

## 2022-07-09 DIAGNOSIS — F1290 Cannabis use, unspecified, uncomplicated: Secondary | ICD-10-CM | POA: Insufficient documentation

## 2022-07-09 LAB — POCT URINE DRUG SCREEN - MANUAL ENTRY (I-SCREEN)
POC Amphetamine UR: NOT DETECTED
POC Buprenorphine (BUP): NOT DETECTED
POC Cocaine UR: POSITIVE — AB
POC Marijuana UR: POSITIVE — AB
POC Methadone UR: NOT DETECTED
POC Methamphetamine UR: NOT DETECTED
POC Morphine: POSITIVE — AB
POC Oxazepam (BZO): NOT DETECTED
POC Oxycodone UR: NOT DETECTED
POC Secobarbital (BAR): NOT DETECTED

## 2022-07-09 LAB — POC SARS CORONAVIRUS 2 AG: SARSCOV2ONAVIRUS 2 AG: NEGATIVE

## 2022-07-09 MED ORDER — TRAZODONE HCL 50 MG PO TABS
50.0000 mg | ORAL_TABLET | Freq: Every evening | ORAL | Status: DC | PRN
  Administered 2022-07-10 – 2022-07-13 (×3): 50 mg via ORAL
  Filled 2022-07-09 (×2): qty 1
  Filled 2022-07-09: qty 14
  Filled 2022-07-09: qty 1

## 2022-07-09 MED ORDER — LOPERAMIDE HCL 2 MG PO CAPS: 2.0000 mg | ORAL_CAPSULE | ORAL | Status: AC | PRN

## 2022-07-09 MED ORDER — LORAZEPAM 1 MG PO TABS
1.0000 mg | ORAL_TABLET | Freq: Four times a day (QID) | ORAL | Status: DC
  Administered 2022-07-10 – 2022-07-11 (×5): 1 mg via ORAL
  Filled 2022-07-09 (×5): qty 1

## 2022-07-09 MED ORDER — ALUM & MAG HYDROXIDE-SIMETH 200-200-20 MG/5ML PO SUSP: 30.0000 mL | ORAL | Status: DC | PRN

## 2022-07-09 MED ORDER — ADULT MULTIVITAMIN W/MINERALS CH
1.0000 | ORAL_TABLET | Freq: Every day | ORAL | Status: DC
  Administered 2022-07-10 – 2022-07-14 (×5): 1 via ORAL
  Filled 2022-07-09 (×5): qty 1

## 2022-07-09 MED ORDER — LORAZEPAM 1 MG PO TABS: 1.0000 mg | ORAL_TABLET | Freq: Every day | ORAL | Status: DC

## 2022-07-09 MED ORDER — ONDANSETRON 4 MG PO TBDP: 4.0000 mg | ORAL_TABLET | Freq: Four times a day (QID) | ORAL | Status: AC | PRN

## 2022-07-09 MED ORDER — HYDROXYZINE HCL 25 MG PO TABS: 25.0000 mg | ORAL_TABLET | Freq: Three times a day (TID) | ORAL | Status: DC | PRN

## 2022-07-09 MED ORDER — MAGNESIUM HYDROXIDE 400 MG/5ML PO SUSP: 30.0000 mL | Freq: Every day | ORAL | Status: DC | PRN

## 2022-07-09 MED ORDER — THIAMINE MONONITRATE 100 MG PO TABS
100.0000 mg | ORAL_TABLET | Freq: Every day | ORAL | Status: DC
  Administered 2022-07-10 – 2022-07-14 (×5): 100 mg via ORAL
  Filled 2022-07-09 (×5): qty 1

## 2022-07-09 MED ORDER — LORAZEPAM 1 MG PO TABS: 1.0000 mg | ORAL_TABLET | Freq: Four times a day (QID) | ORAL | Status: AC | PRN

## 2022-07-09 MED ORDER — LORAZEPAM 1 MG PO TABS: 1.0000 mg | ORAL_TABLET | Freq: Three times a day (TID) | ORAL | Status: DC

## 2022-07-09 MED ORDER — ACETAMINOPHEN 325 MG PO TABS: 650.0000 mg | ORAL_TABLET | Freq: Four times a day (QID) | ORAL | Status: DC | PRN

## 2022-07-09 MED ORDER — LORAZEPAM 1 MG PO TABS: 1.0000 mg | ORAL_TABLET | Freq: Two times a day (BID) | ORAL | Status: DC

## 2022-07-09 MED ORDER — HYDROXYZINE HCL 25 MG PO TABS: 25.0000 mg | ORAL_TABLET | Freq: Four times a day (QID) | ORAL | Status: AC | PRN

## 2022-07-09 NOTE — BH Assessment (Signed)
Comprehensive Clinical Assessment (CCA) Note  07/09/2022 Juan Hood [id number]  DISPOSITION: Completed CCA accompanied by Juan Bering, NP who completed MSE and admitted Pt to [facility].  The patient demonstrates the following risk factors for suicide: Chronic risk factors for suicide include: substance use disorder. Acute risk factors for suicide include: unemployment, social withdrawal/isolation, and loss (financial, interpersonal, professional). Protective factors for this patient include: positive social support. Considering these factors, the overall suicide risk at this point appears to be Hood. Patient is appropriate for outpatient follow up.  Pt is a 47 year old single male who present to [facility] accompanied by his friends, Juan Hood and Juan Hood [phone], who participated in assessment at Pt's request. Pt is requesting substance abuse treatment. He states he has been using alcohol, cocaine, and marijuana since adolescence and cannot remember a day when he did not use some substance. He states he is using alcohol and crack daily and smoking marijuana 2-3 times per week (see details below). He acknowledges experiencing alcohol withdrawal when he stops drinking including tremors, sweats, and nausea; he denies history of seizures. He says he has never sought substance abuse treatment before and is seeking treatment today with the encouragement of his friends. He describes his mood as "down" and Pt acknowledges symptoms including social withdrawal, loss of interest in usual pleasures, fatigue, irritability, decreased concentration, decreased sleep, decreased appetite and feelings of worthlessness and hopelessness. He states he feels lonely. He says he has experienced suicidal thoughts in the past with no plan or intent. He denies current suicidal ideation or history of suicide attempts. Pt denies any history of intentional self-injurious behaviors. Pt denies current homicidal ideation or history of  violence. Pt denies any history of auditory or visual hallucinations.   Pt identifies several stressors. He describes being homeless for the past 7-8 years and is currently staying under a bridge. 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 He states he has an eighth grade education. He says he was raised in the foster care system and cannot identify any family support. He identifies Juan Hood and Juan Hood as his primary support. He has charges pending in [employer] for petty larceny and failure to appear in court. He denies history of abuse. He denies access to firearms. He denies any history of mental health or substance abuse treatment.  Pt is casually dressed, alert and oriented x4. Pt speaks in a clear tone, at moderate volume and normal pace. Motor behavior appears normal. Eye contact is good. Pt's mood is depressed and anxious; affect is congruent with mood. Thought process is coherent and relevant. There is no indication he is currently responding to internal stimuli or experiencing delusional thought content. He is cooperative and willing to be admitted for substance abuse treatment.  Chief Complaint:  Chief Complaint  Patient presents with   Addiction Problem   Visit Diagnosis:  F10.20 Alcohol use disorder, Severe F14.20 Cocaine use disorder, Severe F12.20 Cannabis use disorder, Moderate   CCA Screening, Triage and Referral (STR)  Patient Reported Information How did you hear about [place]? Family/Friend  What Is the Reason for Your Visit/Call Today? Pt presents to [facility] voluntarily with friend. Pt is wanting help with addiction/Detox.Pt reports addicition with alcohol,crack-cocaine, and marijuana. Pt denies SI and HI. Pt did not report AVH.  How Long Has This Been Causing You Problems? > than 6 months  What Do You Feel Would Help You the Most Today?  Alcohol or Drug Use Treatment   Have You Recently Had Any Thoughts About Hurting  Yourself? No  Are You Planning to Commit Suicide/Harm Yourself At This time? No   Flowsheet Row ED from 07/09/2022 in [place] No Risk       Have you Recently Had Thoughts About [place]? No  Are You Planning to Harm Someone at This Time? No  Explanation: Pt denies current suicidal or homicidal ideation   Have You Used Any Alcohol or Drugs in the Past 24 Hours? Yes  What Did You Use and How Much? A can of beer & not a lot of crack per pt report   Do You Currently Have a Therapist/Psychiatrist? No  Name of Therapist/Psychiatrist: Name of Therapist/Psychiatrist: No mental health providers   Have You Been Recently Discharged From Any Office Practice or Programs? No  Explanation of Discharge From Practice/Program: Pt has not received mental health or substance abuse treatment.     CCA Screening Triage Referral Assessment Type of Contact: Face-to-Face  Telemedicine Service Delivery:   Is this Initial or Reassessment?   Date Telepsych consult ordered in [facility]:    Time Telepsych consult ordered in [facility]:    Location of Assessment: [facility] Assessment Services  Provider Location: [facility] Assessment Services   Collateral Involvement: Friends: Juan Hood and Juan Hood [phone]   Does Patient Have a Court Appointed Legal Guardian? No  Legal Guardian Contact Information: No legal guardian  Copy of Legal Guardianship Form: -- (No legal guardian)  Legal Guardian Notified of Arrival: -- (No legal guardian)  Legal Guardian Notified of Pending Discharge: -- (No legal guardian)  If Minor and Not Living with Parent(s), Who has Custody? NA  Is CPS involved or ever been involved? Never  Is APS involved or ever been involved? Never   Patient Determined To Be At Risk for Harm To Self or Others Based on Review of Patient Reported Information or Presenting Complaint? No  Method: -- (Pt denies homicidal  ideation)  Availability of Means: -- (Pt denies homicidal ideation)  Intent: -- (Pt denies homicidal ideation)  Notification Required: -- (Pt denies homicidal ideation)  Additional Information for Danger to Others Potential: -- (Pt denies homicidal ideation)  Additional Comments for Danger to Others Potential: Pt denies homicidal ideation  Are There Guns or Other Weapons in Your Home? No  Types of Guns/Weapons: Pt denies access to firearms  Are These Weapons Safely Secured?                            -- (Pt denies access to firearms)  Who Could Verify You Are Able To Have These Secured: Pt denies access to firearms  Do You Have any Outstanding Charges, Pending Court Dates, Parole/Probation? Pt has been charged with petty larceny and failure to appear in court  Contacted To Inform of Risk of Harm To Self or Others: Unable to Contact:    Does Patient Present under Involuntary Commitment? No    [place] of Residence: [place]   Patient Currently Receiving the Following Services: Not Receiving Services   Determination of Need: Urgent (48 hours)   Options For Referral: [facility]; Facility-Based Crisis; Chemical Dependency Intensive Outpatient Therapy (CDIOP)     CCA Biopsychosocial Patient Reported Schizophrenia/Schizoaffective Diagnosis in Past: No   Strengths: Pt is motivated for treatment   Mental Health Symptoms Depression:   Change in energy/activity;  Difficulty Concentrating; Hopelessness; Increase/decrease in appetite; Weight gain/loss; Irritability; Sleep (too much or little); Worthlessness   Duration of Depressive symptoms:  Duration of Depressive Symptoms: Greater than two weeks   Mania:   None   Anxiety:    Worrying; Tension; Sleep; Difficulty concentrating   Psychosis:   None   Duration of Psychotic symptoms:    Trauma:   None   Obsessions:   None   Compulsions:   None   Inattention:   N/A   Hyperactivity/Impulsivity:   N/A    Oppositional/Defiant Behaviors:   N/A   Emotional Irregularity:   None   Other Mood/Personality Symptoms:   None noted    Mental Status Exam Appearance and self-care  Stature:   Average   Weight:   Average weight   Clothing:   Casual   Grooming:   Normal   Cosmetic use:   None   Posture/gait:   Normal   Motor activity:   Not Remarkable   Sensorium  Attention:   Normal   Concentration:   Normal   Orientation:   X5   Recall/memory:   Normal   Affect and Mood  Affect:   Depressed; Anxious   Mood:   Anxious; Depressed   Relating  Eye contact:   Normal   Facial expression:   Responsive   Attitude toward examiner:   Cooperative   Thought and Language  Speech flow:  Normal   Thought content:   Appropriate to Mood and Circumstances   Preoccupation:   None   Hallucinations:   None   Organization:   Coherent   [employer] of Knowledge:   Fair   Intelligence:   Average   Abstraction:   Functional   Judgement:   Fair   Dance movement psychotherapist:   Realistic   Insight:   Fair   Decision Making:   Normal   Social Functioning  Social Maturity:   Isolates   Social Judgement:   Normal; "Chief of Staff"   Stress  Stressors:   Housing; Armed forces operational officer; Office manager Ability:   Deficient supports   Skill Deficits:   None   Supports:   Friends/Service system; Support needed     Religion: Religion/Spirituality Are You A Religious Person?: Yes What is Your Religious Affiliation?: Christian How Might This Affect Treatment?: Unknown  Leisure/Recreation: Leisure / Recreation Do You Have Hobbies?: No  Exercise/Diet: Exercise/Diet Do You Exercise?: No Have You Gained or Lost A Significant Amount of Weight in the Past Six Months?: No Do You Follow a Special Diet?: No Do You Have Any Trouble Sleeping?: Yes Explanation of Sleeping Difficulties: Pt reports sleeping 2-3 hours per night.   CCA  Employment/Education Employment/Work Situation: Employment / Work Situation Employment Situation: Unemployed Patient's Job has Been Impacted by Current Illness: Yes Describe how Patient's Job has Been Impacted: Pt has been unemployed for 8 years Has Patient ever Been in Equities trader?: No  Education: Education Is Patient Currently Attending School?: No Last Grade Completed: 8 Did You Product manager?: No Did You Have An Individualized Education Program (IIEP): No Did You Have Any Difficulty At [employer]?: No Patient's Education Has Been Impacted by Current Illness: No   CCA Family/Childhood History Family and Relationship History: Family history Marital status: Single Does patient have children?: No  Childhood History:  Childhood History By whom was/is the patient raised?: Foster parents Did patient suffer any verbal/emotional/physical/sexual abuse as a child?: No Did patient suffer from severe childhood neglect?: No Has  patient ever been sexually abused/assaulted/raped as an adolescent or adult?: No Was the patient ever a victim of a crime or a disaster?: No Witnessed domestic violence?: No Has patient been affected by domestic violence as an adult?: No       CCA Substance Use Alcohol/Drug Use: Alcohol / Drug Use Pain Medications: Denies abuse Prescriptions: Denies abuse Over the Counter: Denies abuse History of alcohol / drug use?: Yes Longest period of sobriety (when/how long): Pt cannot remember going a day without using substances Negative Consequences of Use: Financial Withdrawal Symptoms: Tremors, Sweats, Nausea / Vomiting Substance #1 Name of Substance 1: Alcohol 1 - Age of First Use: 18 1 - Amount (size/oz): Over 12 cans of beer 1 - Frequency: Daily 1 - Duration: Ongoing 1 - Last Use / Amount: 07/09/2022, 3 cans of beer 1 - Method of Aquiring: Store 1- Route of Use: Oral ingestion Substance #2 Name of Substance 2: Cocaine (crack) 2 - Age of First Use:  16 2 - Amount (size/oz): Approximately $50 worth 2 - Frequency: Daily 2 - Duration: Ongoing 2 - Last Use / Amount: 07/09/2022, $50 worth 2 - Method of Aquiring: Unknown 2 - Route of Substance Use: Smoke inhalation Substance #3 Name of Substance 3: Marijuana 3 - Age of First Use: 16 3 - Amount (size/oz): Varies 3 - Frequency: 2-3 times per week 3 - Duration: Ongoing 3 - Last Use / Amount: 07/07/2022 3 - Method of Aquiring: Unknown 3 - Route of Substance Use: Smoke inhalation                   ASAM's:  Six Dimensions of Multidimensional Assessment  Dimension 1:  Acute Intoxication and/or Withdrawal Potential:   Dimension 1:  Description of individual's past and current experiences of substance use and withdrawal: Pt reports using alcohol, crack, and marijuana daily for years  Dimension 2:  Biomedical Conditions and Complications:   Dimension 2:  Description of patient's biomedical conditions and  complications: None  Dimension 3:  Emotional, Behavioral, or Cognitive Conditions and Complications:  Dimension 3:  Description of emotional, behavioral, or cognitive conditions and complications: Pt reports symptoms of depression and anxiety  Dimension 4:  Readiness to Change:  Dimension 4:  Description of Readiness to Change criteria: Pt says he wants substance abuse treatment  Dimension 5:  Relapse, Continued use, or Continued Problem Potential:  Dimension 5:  Relapse, continued use, or continued problem potential critiera description: Pt has no history of sobriety  Dimension 6:  Recovery/Living Environment:  Dimension 6:  Recovery/Iiving environment criteria description: Pt is homeless and unsheltered  ASAM Severity Score: ASAM's Severity Rating Score: 12  ASAM Recommended Level of Treatment: ASAM Recommended Level of Treatment: Level III Residential Treatment   Substance use Disorder (SUD) Substance Use Disorder (SUD)  Checklist Symptoms of Substance Use: Continued use despite  having a persistent/recurrent physical/psychological problem caused/exacerbated by use, Continued use despite persistent or recurrent social, interpersonal problems, caused or exacerbated by use, Evidence of tolerance, Evidence of withdrawal (Comment), Large amounts of time spent to obtain, use or recover from the substance(s), Persistent desire or unsuccessful efforts to cut down or control use, Presence of craving or strong urge to use, Recurrent use that results in a failure to fulfill major role obligations (work, school, home), Repeated use in physically hazardous situations, Social, occupational, recreational activities given up or reduced due to use, Substance(s) often taken in larger amounts or over longer times than was intended  Recommendations for Services/Supports/Treatments:  Recommendations for Services/Supports/Treatments Recommendations For Services/Supports/Treatments: Facility Based Crisis  Discharge Disposition: Discharge Disposition Medical Exam completed: Yes Disposition of Patient: Admit  DSM5 Diagnoses: Patient Active Problem List   Diagnosis Date Noted   Cocaine use disorder ([place]) 07/09/2022   Cocaine abuse ([place]) 12/08/2019   Alcohol abuse 12/08/2019   Tobacco abuse 12/08/2019   Acute hypoxic Respiratory failure ([place]) 12/08/2019     Referrals to Alternative Service(s): Referred to Alternative Service(s):   Place:   Date:   Time:    Referred to Alternative Service(s):   Place:   Date:   Time:    Referred to Alternative Service(s):   Place:   Date:   Time:    Referred to Alternative Service(s):   Place:   Date:   Time:     Evelena Peat, [facility]

## 2022-07-09 NOTE — ED Provider Notes (Signed)
Facility Based Crisis Admission H&P  Date: 07/10/22 Patient Name: Juan Hood MRN: [id number] Chief Complaint: substance use Chief Complaint  Patient presents with   Addiction Problem      Diagnoses:  Final diagnoses:  Alcohol use disorder  Cocaine use disorder [facility])    JJO:ACZYSA F. Drost is a 47 y/o male presenting voluntarily to [facility] accompanied by two friends Lane Hacker and [place]. Patient states that he wants helps with alcohol use, crack cocaine and housing assistance. Patient reports that he has been using 3 grams of crack cocaine daily, smoking marijuana about 3-4 times a week and drinking a 12 pack of beer daily. Patient reports that he is currently unemployed and homeless and living [place] a bridge. Patient reports that he drank  three beers today. Patient reports that he last used crack cocaine two hours ago and last smoked marijuana two days ago. Patient stated that he tried methamphetamine yesterday.   Patient reports that he started using crack cocaine and smoking marijuana around age 89 and drinking at age 37. Patient denies that he has had any sustained periods of sobriety in his life. Patient reports that he is unemployed and homeless. Patient denies any other illicit substances at this time. Patient reports that he does experience alcohol withdrawal symptoms of nausea, tremors and has had blackouts from drinking excessively.  Patient denies experiencing any seizures.   Patient has been experiencing depression, anxiety, sadness, racing thoughts, self-isolation, hopelessness, worthlessness, decreased focus and concentration. Patient reports that he is easily annoyed, frustrated and angry. Patient is alert oriented x3, calm and cooperative during but mood is depressed and anxious with a flat, blunted affect. Patient denies any auditory or visual hallucinations and does not appear to be responding to any internal or external stimuli. Patient denies any previous inpatient  treatment for mental health or substance abuse. Per chart review patient had an accidental overdose from cocaine on 12/07/19 and was treated and released. Patient is not currently taking any medications and is not being followed by any outpatient provider for mental health services.  Patient's UDS is positive for cocaine, morphine, and marijuana and alcohol level is 94 mg/dL. Patient will be admitted to [facility] for alcohol detox and substance abuse  treatment and connection to outpatient providers and other community resources.    PHQ 2-9:  [facility] from 07/09/2022 in [facility]  Thoughts that you would be better off dead, or of hurting yourself in some way Not at all  PHQ-9 Total Score 6       [facility] from 07/09/2022 in [place] No Risk        Total Time spent with patient: 30 minutes  Musculoskeletal  Strength & Muscle Tone: within normal limits Gait & Station: normal Patient leans: N/A  Psychiatric Specialty Exam  Presentation General Appearance:  Casual  Eye Contact: Fair  Speech: Clear and Coherent  Speech Volume: Normal  Handedness: Right   Mood and Affect  Mood: Depressed; Anxious  Affect: Blunt; Flat   Thought Process  Thought Processes: Coherent  Descriptions of Associations:Intact  Orientation:Full (Time, Place and Person)  Thought Content:WDL  Diagnosis of Schizophrenia or Schizoaffective disorder in past: No   Hallucinations:Hallucinations: None  Ideas of Reference:None  Suicidal Thoughts:No data recorded Homicidal Thoughts:No data recorded  Sensorium  Memory: Immediate Fair; Remote Fair; Recent Fair  Judgment: Fair  Insight: Fair   Community education officer  Concentration: Fair  Attention Span: Fair  Recall: [employer] of Knowledge: Fair  Language: Good   Psychomotor Activity  Psychomotor Activity: Psychomotor Activity: Normal   Assets  Assets: Communication Skills; Desire for Improvement; Social Support; Physical Health   Sleep  Sleep: Sleep: Poor Number of Hours of Sleep: -1   Nutritional Assessment (For OBS and FBC admissions only) Has the patient had a weight loss or gain of 10 pounds or more in the last 3 months?: No Has the patient had a decrease in food intake/or appetite?: No Does the patient have eating habits or behaviors that may be  indicators of an eating disorder including binging or inducing vomiting?: No Has the patient recently lost weight without trying?: 0 Has the patient been eating poorly because of a decreased appetite?: 0 Malnutrition Screening Tool Score: 0    Physical Exam HENT:     Head: Normocephalic and atraumatic.     Nose: Nose normal.  Eyes:     Pupils: Pupils are equal, round, and reactive to light.  Cardiovascular:     Rate and Rhythm: Normal rate.  Pulmonary:     Effort: Pulmonary effort is normal.  Abdominal:     General: Abdomen is flat.  Musculoskeletal:        General: Normal range of motion.     Cervical back: Normal range of motion.  Skin:    General: Skin is warm.  Neurological:     Mental Status: He is alert and oriented to person, place, and time.  Psychiatric:        Attention and Perception: He is inattentive.        Mood and Affect: Mood is anxious. Affect is blunt and flat.        Speech: Speech normal.        Behavior: Behavior is cooperative.        Thought Content: Thought content does not include suicidal ideation. Thought content does not include suicidal plan.        Cognition and Memory: Cognition normal.  Judgment: Judgment is impulsive.    Review of Systems  Constitutional: Negative.   HENT: Negative.    Eyes: Negative.   Respiratory: Negative.    Cardiovascular: Negative.   Gastrointestinal: Negative.   Genitourinary: Negative.   Musculoskeletal: Negative.   Skin: Negative.   Neurological: Negative.   Endo/Heme/Allergies: Negative.   Psychiatric/Behavioral:  Positive for depression. The patient is nervous/anxious.     Blood pressure (!) 132/102, pulse 98, temperature 97.7 F (36.5 C), temperature source Oral, resp. rate 18, SpO2 95 %. There is no height or weight on file to calculate BMI.  Past Psychiatric History: Denies any prior inpatient treatment  Is the patient at risk to self? No  Has the patient been a risk to self in the past 6  months? No .    Has the patient been a risk to self within the distant past? No   Is the patient a risk to others? No   Has the patient been a risk to others in the past 6 months? No   Has the patient been a risk to others within the distant past? No   Past Medical History:  Past Medical History:  Diagnosis Date   Substance abuse (HCC)    No past surgical history on file.  Family History:  Family History  Family history unknown: Yes    Social History:  Social History   Socioeconomic History   Marital status: Single    Spouse name: Not on file   Number of children: Not on file   Years of education: Not on file   Highest education level: Not on file  Occupational History   Not on file  Tobacco Use   Smoking status: Every Day    Types: Cigarettes   Smokeless tobacco: Never  Substance and Sexual Activity   Alcohol use: Yes   Drug use: Yes    Types: Cocaine, Marijuana   Sexual activity: Yes    Birth control/protection: Condom  Other Topics Concern   Not on file  Social History Narrative   Not on file   Social Determinants of Health   Financial Resource Strain: Not on file  Food Insecurity: Not on file  Transportation Needs: Not on file  Physical Activity: Not on file  Stress: Not on file  Social Connections: Not on file  Intimate Partner Violence: Not on file    SDOH:  SDOH Screenings   Depression (PHQ2-9): Medium Risk (07/09/2022)  Tobacco Use: High Risk (07/09/2020)    Last Labs:  Admission on 07/09/2022  Component Date Value Ref Range Status   SARS Coronavirus 2 by RT PCR 07/09/2022 NEGATIVE  NEGATIVE Final   Comment: (NOTE) SARS-CoV-2 target nucleic acids are NOT DETECTED.  The SARS-CoV-2 RNA is generally detectable in upper respiratory specimens during the acute phase of infection. The lowest concentration of SARS-CoV-2 viral copies this assay can detect is 138 copies/mL. A negative result does not preclude SARS-Cov-2 infection and should not be  used as the sole basis for treatment or other patient management decisions. A negative result may occur with  improper specimen collection/handling, submission of specimen other than nasopharyngeal swab, presence of viral mutation(s) within the areas targeted by this assay, and inadequate number of viral copies(<138 copies/mL). A negative result must be combined with clinical observations, patient history, and epidemiological information. The expected result is Negative.  Fact Sheet for Patients:  BloggerCourse.com  Fact Sheet for Healthcare Providers:  SeriousBroker.it  This test is no  t yet approved or cleared by the [place] and  has been authorized for detection and/or diagnosis of SARS-CoV-2 by FDA under an Emergency Use Authorization (EUA). This EUA will remain  in effect (meaning this test can be used) for the duration of the COVID-19 declaration under Section 564(b)(1) of the Act, 21 U.S.C.section 360bbb-3(b)(1), unless the authorization is terminated  or revoked sooner.       Influenza A by PCR 07/09/2022 NEGATIVE  NEGATIVE Final   Influenza B by PCR 07/09/2022 NEGATIVE  NEGATIVE Final   Comment: (NOTE) The Xpert Xpress SARS-CoV-2/FLU/RSV plus assay is intended as an aid in the diagnosis of influenza from Nasopharyngeal swab specimens and should not be used as a sole basis for treatment. Nasal washings and aspirates are unacceptable for Xpert Xpress SARS-CoV-2/FLU/RSV testing.  Fact Sheet for Patients: BloggerCourse.com  Fact Sheet for Healthcare Providers: SeriousBroker.it  This test is not yet approved or cleared by the [place] FDA and has been authorized for detection and/or diagnosis of SARS-CoV-2 by FDA under an Emergency Use Authorization (EUA). This EUA will remain in effect (meaning this test can be used) for the  duration of the COVID-19 declaration under Section 564(b)(1) of the Act, 21 U.S.C. section 360bbb-3(b)(1), unless the authorization is terminated or revoked.     Resp Syncytial Virus by PCR 07/09/2022 NEGATIVE  NEGATIVE Final   Comment: (NOTE) Fact Sheet for Patients: BloggerCourse.com  Fact Sheet for Healthcare Providers: SeriousBroker.it  This test is not yet approved or cleared by the [place] FDA and has been authorized for detection and/or diagnosis of SARS-CoV-2 by FDA under an Emergency Use Authorization (EUA). This EUA will remain in effect (meaning this test can be used) for the duration of the COVID-19 declaration under Section 564(b)(1) of the Act, 21 U.S.C. section 360bbb-3(b)(1), unless the authorization is terminated or revoked.  Performed at [facility], [address] [place], [place] [address]    Sodium 07/09/2022 137  135 - 145 mmol/L Final   Potassium 07/09/2022 4.7  3.5 - 5.1 mmol/L Final   Chloride 07/09/2022 101  98 - 111 mmol/L Final   CO2 07/09/2022 25  22 - 32 mmol/L Final   Glucose, Bld 07/09/2022 79  70 - 99 mg/dL Final   Glucose reference range applies only to samples taken after fasting for at least 8 hours.   BUN 07/09/2022 6  6 - 20 mg/dL Final   Creatinine, Ser 07/09/2022 0.85  0.61 - 1.24 mg/dL Final   Calcium 09/98/3382 9.6  8.9 - 10.3 mg/dL Final   Total Protein 50/53/9767 7.7  6.5 - 8.1 g/dL Final   Albumin 34/19/3790 3.9  3.5 - 5.0 g/dL Final   AST 24/02/7352 38  15 - 41 U/L Final   ALT 07/09/2022 43  0 - 44 U/L Final   Alkaline Phosphatase 07/09/2022 94  38 - 126 U/L Final   Total Bilirubin 07/09/2022 0.7  0.3 - 1.2 mg/dL Final   GFR, Estimated 07/09/2022 >60  >60 mL/min Final   Comment: (NOTE) Calculated using the CKD-EPI Creatinine Equation (2021)    Anion gap 07/09/2022 11  5 - 15 Final   Performed at [facility], [address], [place], [place] [address]   WBC  07/09/2022 8.4  4.0 - 10.5 K/uL Final   RBC 07/09/2022 5.12  4.22 - 5.81 MIL/uL Final   Hemoglobin 07/09/2022 17.0  13.0 - 17.0 g/dL Final   HCT 26/83/4196 48.0  39.0 -  52.0 % Final   MCV 07/09/2022 93.8  80.0 - 100.0 fL Final   MCH 07/09/2022 33.2  26.0 - 34.0 pg Final   MCHC 07/09/2022 35.4  30.0 - 36.0 g/dL Final   RDW 07/09/2022 11.5  11.5 - 15.5 % Final   Platelets 07/09/2022 297  150 - 400 K/uL Final   nRBC 07/09/2022 0.0  0.0 - 0.2 % Final   Neutrophils Relative % 07/09/2022 58  % Final   Neutro Abs 07/09/2022 4.8  1.7 - 7.7 K/uL Final   Lymphocytes Relative 07/09/2022 36  % Final   Lymphs Abs 07/09/2022 3.0  0.7 - 4.0 K/uL Final   Monocytes Relative 07/09/2022 4  % Final   Monocytes Absolute 07/09/2022 0.3  0.1 - 1.0 K/uL Final   Eosinophils Relative 07/09/2022 2  % Final   Eosinophils Absolute 07/09/2022 0.2  0.0 - 0.5 K/uL Final   Basophils Relative 07/09/2022 0  % Final   Basophils Absolute 07/09/2022 0.0  0.0 - 0.1 K/uL Final   Immature Granulocytes 07/09/2022 0  % Final   Abs Immature Granulocytes 07/09/2022 0.02  0.00 - 0.07 K/uL Final   Performed at [facility], [place] [address], [place], [place] [address]   Alcohol, Ethyl (B) 07/09/2022 94 (H)  <10 mg/dL Final   Comment: (NOTE) Lowest detectable limit for serum alcohol is 10 mg/dL.  For medical purposes only. Performed at [facility], [place] [address] [place], [place] [address]    Cholesterol 07/09/2022 218 (H)  0 - 200 mg/dL Final   Triglycerides 07/09/2022 88  <150 mg/dL Final   HDL 07/09/2022 94  >40 mg/dL Final   Total CHOL/HDL Ratio 07/09/2022 2.3  RATIO Final   VLDL 07/09/2022 18  0 - 40 mg/dL Final   LDL Cholesterol 07/09/2022 106 (H)  0 - 99 mg/dL Final   Comment:        Total Cholesterol/HDL:CHD Risk Coronary Heart Disease Risk Table                     Men   Women  1/2 Average Risk   3.4   3.3  Average Risk       5.0   4.4  2 X Average Risk   9.6   7.1  3 X Average Risk  23.4   11.0         Use the calculated Patient Ratio above and the CHD Risk Table to determine the patient's CHD Risk.        ATP III CLASSIFICATION (LDL):  <100     mg/dL   Optimal  100-129  mg/dL   Near or Above                    Optimal  130-159  mg/dL   Borderline  160-189  mg/dL   High  >190     mg/dL   Very High Performed at [facility] [address], [place], [place] [address]    POC Amphetamine UR 07/09/2022 None Detected  NONE DETECTED (Cut Off Level 1000 ng/mL) Final   POC Secobarbital (BAR) 07/09/2022 None Detected  NONE DETECTED (Cut Off Level 300 ng/mL) Final   POC Buprenorphine (BUP) 07/09/2022 None Detected  NONE DETECTED (Cut Off Level 10 ng/mL) Final   POC Oxazepam (BZO) 07/09/2022 None Detected  NONE DETECTED (Cut Off Level 300 ng/mL) Final   POC Cocaine UR 07/09/2022 Positive (A)  NONE DETECTED (Cut  Off Level 300 ng/mL) Final   POC Methamphetamine UR 07/09/2022 None Detected  NONE DETECTED (Cut Off Level 1000 ng/mL) Final   POC Morphine 07/09/2022 Positive (A)  NONE DETECTED (Cut Off Level 300 ng/mL) Final   POC Methadone UR 07/09/2022 None Detected  NONE DETECTED (Cut Off Level 300 ng/mL) Final   POC Oxycodone UR 07/09/2022 None Detected  NONE DETECTED (Cut Off Level 100 ng/mL) Final   POC Marijuana UR 07/09/2022 Positive (A)  NONE DETECTED (Cut Off Level 50 ng/mL) Final   SARSCOV2ONAVIRUS 2 AG 07/09/2022 NEGATIVE  NEGATIVE Final   Comment: (NOTE) SARS-CoV-2 antigen NOT DETECTED.   Negative results are presumptive.  Negative results do not preclude SARS-CoV-2 infection and should not be used as the sole basis for treatment or other patient management decisions, including infection  control decisions, particularly in the presence of clinical signs and  symptoms consistent with COVID-19, or in those who have been in contact with the virus.  Negative results must be combined with clinical observations, patient history, and epidemiological information. The expected result  is Negative.  Fact Sheet for Patients: https://www.jennings-kim.com/  Fact Sheet for Healthcare Providers: https://alexander-rogers.biz/  This test is not yet approved or cleared by the [place] FDA and  has been authorized for detection and/or diagnosis of SARS-CoV-2 by FDA under an Emergency Use Authorization (EUA).  This EUA will remain in effect (meaning this test can be used) for the duration of  the COV                          ID-19 declaration under Section 564(b)(1) of the Act, 21 U.S.C. section 360bbb-3(b)(1), unless the authorization is terminated or revoked sooner.      Allergies: Patient has no known allergies.  PTA Medications: None  Long Term Goals: Improvement in symptoms so as ready for discharge  Short Term Goals: Patient will verbalize feelings in meetings with treatment team members., Patient will attend at least of 50% of the groups daily., Pt will complete the PHQ9 on admission, day 3 and discharge., and Patient will participate in completing the [place] Suicide Severity Rating Scale  Medical Decision Making  [place] is a 47 y/o male presenting voluntarily to [facility] and wants helps with alcohol use, crack cocaine and housing assistance. Patient reports that he has been using 3 grams of crack cocaine daily, smoking marijuana about 3-4 times a week and drinking a 12 pack of beer daily    Recommendations  Based on my evaluation the patient does not appear to have an emergency medical condition. Patient will be admitted to [facility] for alcohol detox and substance abuse.  Jasper Riling, NP 07/10/22  6:19 AM

## 2022-07-09 NOTE — ED Triage Notes (Signed)
Pt presents to [facility] voluntarily with friend. Pt is wanting help with addiction/Detox.Pt reports addicition with alcohol,crack-cocaine, and marijuana. Pt denies SI and HI. Pt did not report AVH.

## 2022-07-09 NOTE — ED Provider Notes (Incomplete)
Facility Based Crisis Admission H&P  Date: 07/09/22 Patient Name: Juan Hood MRN: [id number] Chief Complaint: substance use Chief Complaint  Patient presents with  . Addiction Problem      Diagnoses:  Final diagnoses:  None    LYY:TKPTWS F. Nalepa is a 47 y/o male presenting voluntarily to [facility] accompanied by two friends Markus Daft and [place]. Patient states that he wants helps with alcohol use, crack cocaine and housing assistance. Patient reports that he has been using 3 grams of crack cocaine daily, smoking marijuana about 3-4 times a week and drinking a 12 pack of beer daily. Patient reports that he is currently homeless and living [place] a bridge. Patient reports that he drank today three beers. Patient reports that he last used crack cocaine two hours ago.    Patient reports that he started using crack cocaine and smoking marijuana around age 66 and drinking at age 26. Patient reports that he has not had nay   PHQ 2-9:  [place] [facility] from 07/09/2022 in [facility]  Thoughts that you would be better off dead, or of hurting yourself in some way Not at all  PHQ-9 Total Score 6       [place] [facility] from 07/09/2022 in [place] No Risk        Total Time spent with patient: 30 minutes  Musculoskeletal  Strength & Muscle Tone: within normal limits Gait & Station: normal Patient leans: N/A  Psychiatric Specialty Exam  Presentation General Appearance:  Casual  Eye Contact: Fair  Speech: Clear and Coherent  Speech Volume: Normal  Handedness: Right   Mood and Affect  Mood: Depressed; Anxious  Affect: Blunt; Flat   Thought Process  Thought Processes: Coherent  Descriptions of Associations:Intact  Orientation:Full (Time, Place and Person)  Thought Content:WDL  Diagnosis of Schizophrenia or Schizoaffective disorder in past: No    Hallucinations:Hallucinations: None  Ideas of Reference:None  Suicidal Thoughts:No data recorded Homicidal Thoughts:No data recorded  Sensorium  Memory: Immediate Fair; Remote Fair; Recent Fair  Judgment: Fair  Insight: Fair   Community education officer  Concentration: Fair  Attention Span: Fair  Recall: [employer] of Knowledge: Fair  Language: Good   Psychomotor Activity  Psychomotor Activity: Psychomotor Activity: Normal   Assets  Assets: Communication Skills; Desire for Improvement; Social Support; Physical Health   Sleep  Sleep: Sleep: Poor Number of Hours of Sleep: -1   Nutritional Assessment (For OBS and FBC admissions only) Has the patient had a weight loss or gain of 10 pounds or more in the last 3 months?: No Has the patient had a decrease in food intake/or appetite?: No Does the patient have eating habits or behaviors that may be indicators of an eating disorder including binging or inducing vomiting?: No Has the patient recently lost weight without trying?: 0 Has the patient been eating poorly because of a decreased appetite?: 0 Malnutrition Screening Tool Score: 0    Physical Exam HENT:     Head: Normocephalic and atraumatic.     Nose: Nose normal.  Eyes:     Pupils: Pupils are equal, round, and reactive to light.  Cardiovascular:     Rate and Rhythm: Normal rate.  Pulmonary:     Effort: Pulmonary effort is normal.  Abdominal:     General: Abdomen is flat.  Musculoskeletal:        General: Normal range of motion.     Cervical  back: Normal range of motion.  Skin:    General: Skin is warm.  Neurological:     Mental Status: He is alert and oriented to person, place, and time.  Psychiatric:        Attention and Perception: He is inattentive.        Mood and Affect: Mood is anxious. Affect is blunt and flat.        Speech: Speech normal.        Behavior: Behavior is cooperative.        Thought Content: Thought content does not  include suicidal ideation. Thought content does not include suicidal plan.        Cognition and Memory: Cognition normal.        Judgment: Judgment is impulsive.   ROS  Blood pressure (!) 132/102, pulse 98, temperature 97.7 F (36.5 C), temperature source Oral, resp. rate 18, SpO2 95 %. There is no height or weight on file to calculate BMI.  Past Psychiatric History: ***   Is the patient at risk to self? No  Has the patient been a risk to self in the past 6 months? No .    Has the patient been a risk to self within the distant past? No   Is the patient a risk to others? No   Has the patient been a risk to others in the past 6 months? No   Has the patient been a risk to others within the distant past? No   Past Medical History:  Past Medical History:  Diagnosis Date  . Substance abuse (HCC)    No past surgical history on file.  Family History:  Family History  Family history unknown: Yes    Social History:  Social History   Socioeconomic History  . Marital status: Single    Spouse name: Not on file  . Number of children: Not on file  . Years of education: Not on file  . Highest education level: Not on file  Occupational History  . Not on file  Tobacco Use  . Smoking status: Every Day    Types: Cigarettes  . Smokeless tobacco: Never  Substance and Sexual Activity  . Alcohol use: Yes  . Drug use: Yes    Types: Cocaine, Marijuana  . Sexual activity: Yes    Birth control/protection: Condom  Other Topics Concern  . Not on file  Social History Narrative  . Not on file   Social Determinants of Health   Financial Resource Strain: Not on file  Food Insecurity: Not on file  Transportation Needs: Not on file  Physical Activity: Not on file  Stress: Not on file  Social Connections: Not on file  Intimate Partner Violence: Not on file    SDOH:  SDOH Screenings   Depression (PHQ2-9): Medium Risk (07/09/2022)  Tobacco Use: High Risk (07/09/2020)    Last Labs:   No visits with results within 6 Month(s) from this visit.  Latest known visit with results is:  Admission on 12/07/2019, Discharged on 12/08/2019  Component Date Value Ref Range Status  . Sodium 12/07/2019 141  135 - 145 mmol/L Final  . Potassium 12/07/2019 3.6  3.5 - 5.1 mmol/L Final  . Chloride 12/07/2019 108  98 - 111 mmol/L Final  . CO2 12/07/2019 18 (L)  22 - 32 mmol/L Final  . Glucose, Bld 12/07/2019 106 (H)  70 - 99 mg/dL Final   Glucose reference range applies only to samples taken after fasting for at least  8 hours.  . BUN 12/07/2019 7  6 - 20 mg/dL Final  . Creatinine, Ser 12/07/2019 0.88  0.61 - 1.24 mg/dL Final  . Calcium 12/07/2019 8.6 (L)  8.9 - 10.3 mg/dL Final  . GFR calc non Af Amer 12/07/2019 >60  >60 mL/min Final  . GFR calc Af Amer 12/07/2019 >60  >60 mL/min Final  . Anion gap 12/07/2019 15  5 - 15 Final   Performed at [facility], [place] [address], [place], [place] [address]  . WBC 12/07/2019 12.5 (H)  4.0 - 10.5 K/uL Final  . RBC 12/07/2019 5.10  4.22 - 5.81 MIL/uL Final  . Hemoglobin 12/07/2019 17.0  13.0 - 17.0 g/dL Final  . HCT 12/07/2019 52.7 (H)  39.0 - 52.0 % Final  . MCV 12/07/2019 103.3 (H)  80.0 - 100.0 fL Final  . MCH 12/07/2019 33.3  26.0 - 34.0 pg Final  . MCHC 12/07/2019 32.3  30.0 - 36.0 g/dL Final  . RDW 12/07/2019 11.9  11.5 - 15.5 % Final  . Platelets 12/07/2019 220  150 - 400 K/uL Final  . nRBC 12/07/2019 0.0  0.0 - 0.2 % Final  . Neutrophils Relative % 12/07/2019 84  % Final  . Neutro Abs 12/07/2019 10.5 (H)  1.7 - 7.7 K/uL Final  . Lymphocytes Relative 12/07/2019 12  % Final  . Lymphs Abs 12/07/2019 1.5  0.7 - 4.0 K/uL Final  . Monocytes Relative 12/07/2019 3  % Final  . Monocytes Absolute 12/07/2019 0.4  0.1 - 1.0 K/uL Final  . Eosinophils Relative 12/07/2019 0  % Final  . Eosinophils Absolute 12/07/2019 0.0  0.0 - 0.5 K/uL Final  . Basophils Relative 12/07/2019 1  % Final  . Basophils Absolute 12/07/2019 0.1  0.0 - 0.1 K/uL Final   . Immature Granulocytes 12/07/2019 0  % Final  . Abs Immature Granulocytes 12/07/2019 0.02  0.00 - 0.07 K/uL Final   Performed at [facility], [place] [address], [place], [place] [address]  . SARS Coronavirus 2 12/07/2019 NEGATIVE  NEGATIVE Final   Comment: (NOTE) SARS-CoV-2 target nucleic acids are NOT DETECTED.  The SARS-CoV-2 RNA is generally detectable in upper and lower respiratory specimens during the acute phase of infection. The lowest concentration of SARS-CoV-2 viral copies this assay can detect is 250 copies / mL. A negative result does not preclude SARS-CoV-2 infection and should not be used as the sole basis for treatment or other patient management decisions.  A negative result may occur with improper specimen collection / handling, submission of specimen other than nasopharyngeal swab, presence of viral mutation(s) within the areas targeted by this assay, and inadequate number of viral copies (<250 copies / mL). A negative result must be combined with clinical observations, patient history, and epidemiological information.  Fact Sheet for Patients:   StrictlyIdeas.no  Fact Sheet for Healthcare Providers: BankingDealers.co.za  This test is not yet approved or                           cleared by the [place] FDA and has been authorized for detection and/or diagnosis of SARS-CoV-2 by FDA under an Emergency Use Authorization (EUA).  This EUA will remain in effect (meaning this test can be used) for the duration of the COVID-19 declaration under Section 564(b)(1) of the Act, 21 U.S.C. section 360bbb-3(b)(1), unless the authorization is terminated or revoked sooner.  Performed at [facility], [place]  [address] [place], [place] [address]   . Opiates 12/07/2019 NONE DETECTED  NONE DETECTED Final  . Cocaine 12/07/2019 POSITIVE (A)  NONE DETECTED Final  . Benzodiazepines 12/07/2019 NONE DETECTED  NONE DETECTED  Final  . Amphetamines 12/07/2019 NONE DETECTED  NONE DETECTED Final  . Tetrahydrocannabinol 12/07/2019 POSITIVE (A)  NONE DETECTED Final  . Barbiturates 12/07/2019 NONE DETECTED  NONE DETECTED Final   Comment: (NOTE) DRUG SCREEN FOR MEDICAL PURPOSES ONLY.  IF CONFIRMATION IS NEEDED FOR ANY PURPOSE, NOTIFY LAB WITHIN 5 DAYS.  LOWEST DETECTABLE LIMITS FOR URINE DRUG SCREEN Drug Class                     Cutoff (ng/mL) Amphetamine and metabolites    1000 Barbiturate and metabolites    200 Benzodiazepine                 200 Tricyclics and metabolites     300 Opiates and metabolites        300 Cocaine and metabolites        300 THC                            50 Performed at [facility], [address] [place], [place] [address]   . HIV Screen 4th Generation wRfx 12/08/2019 Non Reactive  Non Reactive Final   Performed at [facility], [address] [place], [place] [address]  . Total CK 12/08/2019 233  49 - 397 U/L Final   Performed at [facility], [address], [place], [place] [address]  . WBC 12/08/2019 13.0 (H)  4.0 - 10.5 K/uL Final  . RBC 12/08/2019 4.37  4.22 - 5.81 MIL/uL Final  . Hemoglobin 12/08/2019 14.4  13.0 - 17.0 g/dL Final  . HCT 59/45/8592 42.1  39.0 - 52.0 % Final  . MCV 12/08/2019 96.3  80.0 - 100.0 fL Final  . MCH 12/08/2019 33.0  26.0 - 34.0 pg Final  . MCHC 12/08/2019 34.2  30.0 - 36.0 g/dL Final  . RDW 92/44/6286 11.9  11.5 - 15.5 % Final  . Platelets 12/08/2019 223  150 - 400 K/uL Final  . nRBC 12/08/2019 0.0  0.0 - 0.2 % Final   Performed at [facility], [address], [place], [place] [address]  . Sodium 12/08/2019 137  135 - 145 mmol/L Final  . Potassium 12/08/2019 4.2  3.5 - 5.1 mmol/L Final  . Chloride 12/08/2019 103  98 - 111 mmol/L Final  . CO2 12/08/2019 24  22 - 32 mmol/L Final  . Glucose, Bld 12/08/2019 105 (H)  70 - 99 mg/dL Final   Glucose reference range applies only to samples taken after fasting for at least  8 hours.  . BUN 12/08/2019 8  6 - 20 mg/dL Final  . Creatinine, Ser 12/08/2019 0.89  0.61 - 1.24 mg/dL Final  . Calcium 11/65/7903 8.3 (L)  8.9 - 10.3 mg/dL Final  . GFR calc non Af Amer 12/08/2019 >60  >60 mL/min Final  . GFR calc Af Amer 12/08/2019 >60  >60 mL/min Final  . Anion gap 12/08/2019 10  5 - 15 Final   Performed at [facility], [address], [place], [place] [address]    Allergies: Patient has no known allergies.  PTA Medications: None  Long Term Goals: Improvement in symptoms so as ready for discharge  Short Term Goals: Patient  will verbalize feelings in meetings with treatment team members., Patient will attend at least of 50% of the groups daily., Pt will complete the PHQ9 on admission, day 3 and discharge., and Patient will participate in completing the [place] Suicide Severity Rating Scale  Medical Decision Making  ***    Recommendations  Based on my evaluation the patient does not appear to have an emergency medical condition. Patient will be admitted to [facility] for alcohol detox and substance abuse.  Jasper Riling, NP 07/09/22  11:19 PM

## 2022-07-10 DIAGNOSIS — F1410 Cocaine abuse, uncomplicated: Secondary | ICD-10-CM | POA: Diagnosis not present

## 2022-07-10 DIAGNOSIS — F1290 Cannabis use, unspecified, uncomplicated: Secondary | ICD-10-CM | POA: Diagnosis not present

## 2022-07-10 DIAGNOSIS — Z1152 Encounter for screening for COVID-19: Secondary | ICD-10-CM | POA: Diagnosis not present

## 2022-07-10 LAB — CBC WITH DIFFERENTIAL/PLATELET
Abs Immature Granulocytes: 0.02 10*3/uL (ref 0.00–0.07)
Basophils Absolute: 0 10*3/uL (ref 0.0–0.1)
Basophils Relative: 0 %
Eosinophils Absolute: 0.2 10*3/uL (ref 0.0–0.5)
Eosinophils Relative: 2 %
HCT: 48 % (ref 39.0–52.0)
Hemoglobin: 17 g/dL (ref 13.0–17.0)
Immature Granulocytes: 0 %
Lymphocytes Relative: 36 %
Lymphs Abs: 3 10*3/uL (ref 0.7–4.0)
MCH: 33.2 pg (ref 26.0–34.0)
MCHC: 35.4 g/dL (ref 30.0–36.0)
MCV: 93.8 fL (ref 80.0–100.0)
Monocytes Absolute: 0.3 10*3/uL (ref 0.1–1.0)
Monocytes Relative: 4 %
Neutro Abs: 4.8 10*3/uL (ref 1.7–7.7)
Neutrophils Relative %: 58 %
Platelets: 297 10*3/uL (ref 150–400)
RBC: 5.12 MIL/uL (ref 4.22–5.81)
RDW: 11.5 % (ref 11.5–15.5)
WBC: 8.4 10*3/uL (ref 4.0–10.5)
nRBC: 0 % (ref 0.0–0.2)

## 2022-07-10 LAB — COMPREHENSIVE METABOLIC PANEL
ALT: 43 U/L (ref 0–44)
AST: 38 U/L (ref 15–41)
Albumin: 3.9 g/dL (ref 3.5–5.0)
Alkaline Phosphatase: 94 U/L (ref 38–126)
Anion gap: 11 (ref 5–15)
BUN: 6 mg/dL (ref 6–20)
CO2: 25 mmol/L (ref 22–32)
Calcium: 9.6 mg/dL (ref 8.9–10.3)
Chloride: 101 mmol/L (ref 98–111)
Creatinine, Ser: 0.85 mg/dL (ref 0.61–1.24)
GFR, Estimated: >60 mL/min (ref >60)
Glucose, Bld: 79 mg/dL (ref 70–99)
Potassium: 4.7 mmol/L (ref 3.5–5.1)
Sodium: 137 mmol/L (ref 135–145)
Total Bilirubin: 0.7 mg/dL (ref 0.3–1.2)
Total Protein: 7.7 g/dL (ref 6.5–8.1)

## 2022-07-10 LAB — LIPID PANEL
Cholesterol: 218 mg/dL — ABNORMAL HIGH (ref 0–200)
HDL: 94 mg/dL (ref >40)
LDL Cholesterol: 106 mg/dL — ABNORMAL HIGH (ref 0–99)
Total CHOL/HDL Ratio: 2.3 RATIO
Triglycerides: 88 mg/dL (ref <150)
VLDL: 18 mg/dL (ref 0–40)

## 2022-07-10 LAB — RESP PANEL BY RT-PCR (RSV, FLU A&B, COVID)  RVPGX2
Influenza A by PCR: NEGATIVE
Influenza B by PCR: NEGATIVE
Resp Syncytial Virus by PCR: NEGATIVE
SARS Coronavirus 2 by RT PCR: NEGATIVE

## 2022-07-10 LAB — ETHANOL: Alcohol, Ethyl (B): 94 mg/dL — ABNORMAL HIGH (ref <10)

## 2022-07-10 MED ORDER — SALINE SPRAY 0.65 % NA SOLN: 1.0000 | NASAL | Status: DC | PRN

## 2022-07-10 MED ORDER — SERTRALINE HCL 25 MG PO TABS
25.0000 mg | ORAL_TABLET | Freq: Every day | ORAL | Status: DC
  Administered 2022-07-10 – 2022-07-14 (×5): 25 mg via ORAL
  Filled 2022-07-10: qty 1
  Filled 2022-07-10: qty 14
  Filled 2022-07-10 (×4): qty 1

## 2022-07-10 NOTE — ED Notes (Signed)
Pt sleeping@this time. Breathing even and unlabored. Will continue to monitor for safety 

## 2022-07-10 NOTE — ED Notes (Signed)
Pt admitted to [facility] for substance use. Pt A&O x4, calm and cooperative. Pt denies SI/HI/AVH. Pt tolerated admission process and skin assessment well. Pt ambulated independently to unit. Oriented to unit/staff. Pt denies any current needs. No signs of acute distress noted. Monitoring for safety.

## 2022-07-10 NOTE — ED Notes (Signed)
Patient denies SI/HI and AVH. Patient reported his back was hurting "probably from the way he slept last night". Patient received Tylenol 650 mg. Patient is having decreased withdrawal from alcohol symptoms. Patient ate breakfast and took his medications without complications. Patient has been calm and cooperative. Patient is being monitored for safety.

## 2022-07-10 NOTE — ED Notes (Signed)
Sandwich, nutrigrain bar, and juice provided per pt request.
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
## 2022-07-10 NOTE — ED Notes (Signed)
Pt asleep in bed. Respirations even and unlabored. Monitoring for safety. 

## 2022-07-10 NOTE — ED Notes (Signed)
Pt sitting in dining room watching TV. A&O x4, calm and cooperative. Denies current SI/HI/AVH. No signs of distress noted. Monitoring for safety.

## 2022-07-10 NOTE — Group Note (Signed)
Group Topic: Communication  Group Date: 07/10/2022 Start Time: 0800 End Time: 0830 Facilitators: Mervyn Skeeters D, NT  Department: [facility]  Number of Participants: 8  Group Focus: communication Treatment Modality:  Psychoeducation Interventions utilized were support Purpose: improve communication skills  Name: Juan Hood Date of Birth: 11-20-1975  MR: [id number]    Level of Participation: moderate Quality of Participation: attentive Interactions with others: gave feedback Mood/Affect: appropriate Triggers (if applicable): NA Cognition: coherent/clear Progress: Moderate Response: NA Plan: follow-up needed  Patients Problems:  Patient Active Problem List   Diagnosis Date Noted   Cocaine use disorder ([place]) 07/09/2022   Cocaine abuse ([place]) 12/08/2019   Alcohol abuse 12/08/2019   Tobacco abuse 12/08/2019   Acute hypoxic Respiratory failure ([place]) 12/08/2019

## 2022-07-11 DIAGNOSIS — Z1152 Encounter for screening for COVID-19: Secondary | ICD-10-CM | POA: Diagnosis not present

## 2022-07-11 DIAGNOSIS — F1290 Cannabis use, unspecified, uncomplicated: Secondary | ICD-10-CM | POA: Diagnosis not present

## 2022-07-11 DIAGNOSIS — F1410 Cocaine abuse, uncomplicated: Secondary | ICD-10-CM | POA: Diagnosis not present

## 2022-07-11 MED ORDER — CHLORDIAZEPOXIDE HCL 25 MG PO CAPS
25.0000 mg | ORAL_CAPSULE | Freq: Four times a day (QID) | ORAL | Status: AC
  Administered 2022-07-11 (×2): 25 mg via ORAL
  Filled 2022-07-11 (×2): qty 1

## 2022-07-11 MED ORDER — CHLORDIAZEPOXIDE HCL 25 MG PO CAPS
25.0000 mg | ORAL_CAPSULE | Freq: Every day | ORAL | Status: AC
  Administered 2022-07-14: 25 mg via ORAL
  Filled 2022-07-11: qty 1

## 2022-07-11 MED ORDER — CHLORDIAZEPOXIDE HCL 25 MG PO CAPS
25.0000 mg | ORAL_CAPSULE | ORAL | Status: AC
  Administered 2022-07-13 (×2): 25 mg via ORAL
  Filled 2022-07-11 (×2): qty 1

## 2022-07-11 MED ORDER — CHLORDIAZEPOXIDE HCL 25 MG PO CAPS
25.0000 mg | ORAL_CAPSULE | Freq: Three times a day (TID) | ORAL | Status: AC
  Administered 2022-07-12 (×3): 25 mg via ORAL
  Filled 2022-07-11 (×3): qty 1

## 2022-07-11 NOTE — ED Notes (Signed)
Pt asleep in bed. Respirations even and unlabored. Monitoring for safety. 

## 2022-07-11 NOTE — ED Notes (Signed)
Patient was given his Librium off schedule as patient was sleeping .

## 2022-07-11 NOTE — ED Notes (Signed)
Pt is in the bed sleeping. Respirations are even and unlabored. No acute distress noted. Will continue to monitor for safety. 

## 2022-07-11 NOTE — ED Notes (Signed)
Pt is in the bed  reading a book. Respirations are even and unlabored. No acute distress noted. Will continue to monitor for safety. 

## 2022-07-11 NOTE — ED Provider Notes (Signed)
Behavioral Health Progress Note  Date and Time: 07/11/2022 12:06 PM Name: GAEL LONDO MRN:  [id number]  Subjective:   Kaeleb Emond is a 47 yr old male who presented on 1/14 to [facility] requesting substance use treatment for EtOH and Crack Cocaine, he was admitted to [facility] on 1/13.  PPHx is significant for Polysubstance Abuse (EtOH, Crack Cocaine) and 1 Accidental Overdose (6/21), and no history of Suicide Attempts, Self Injurious Behavior, or Psychiatric Hospitalizations.  Patient is currently on a Librium taper and has mild withdrawal symptoms.  No history of seizure or DTs.  The patient is currently homeless in [place] and has no medical insurance.  He is interested in residential rehab.  Referral to [facility] residential to be placed by LCSW, will give sober living resources and request the patient make phone calls.  He has no stable housing situation to return to.  Estimated discharge date is Thursday (not yet discussed with the patient).  The patient denies auditory/visual hallucinations and first rank symptoms.  The patient reports good mood, appetite, and sleep. They deny suicidal and homicidal thoughts. The patient denies side effects from their medications.  Review of systems as below. The patient reports experiencing mild withdrawal symptoms.  He has difficulty verbalizing what the symptoms are but states that he develops them each time he stops drinking alcohol.    Diagnosis:  Final diagnoses:  Alcohol use disorder  Cocaine use disorder (HCC)    Total Time spent with patient: 30 minutes  Past Psychiatric History: Polysubstance Abuse (EtOH, Crack Cocaine) and 1 Accidental Overdose (6/21), and no history of Suicide Attempts, Self Injurious Behavior, or Psychiatric Hospitalizations. Past Medical History:  Past Medical History:  Diagnosis Date   Substance abuse (HCC)    No past surgical history on file. Family History:  Family History  Family history unknown: Yes   Family  Psychiatric  History: None Reported Social History:  Social History   Substance and Sexual Activity  Alcohol Use Yes     Social History   Substance and Sexual Activity  Drug Use Yes   Types: Cocaine, Marijuana    Social History   Socioeconomic History   Marital status: Single    Spouse name: Not on file   Number of children: Not on file   Years of education: Not on file   Highest education level: Not on file  Occupational History   Not on file  Tobacco Use   Smoking status: Every Day    Types: Cigarettes   Smokeless tobacco: Never  Substance and Sexual Activity   Alcohol use: Yes   Drug use: Yes    Types: Cocaine, Marijuana   Sexual activity: Yes    Birth control/protection: Condom  Other Topics Concern   Not on file  Social History Narrative   Not on file   Social Determinants of Health   Financial Resource Strain: Not on file  Food Insecurity: Not on file  Transportation Needs: Not on file  Physical Activity: Not on file  Stress: Not on file  Social Connections: Not on file   SDOH:  SDOH Screenings   Depression (PHQ2-9): High Risk (07/10/2022)  Tobacco Use: High Risk (07/09/2020)   Additional Social History:    Pain Medications: Denies abuse Prescriptions: Denies abuse Over the Counter: Denies abuse History of alcohol / drug use?: Yes Longest period of sobriety (when/how long): Pt cannot remember going a day without using substances Negative Consequences of Use: Financial Withdrawal Symptoms: Tremors, Sweats, Nausea /  Vomiting Name of Substance 1: Alcohol 1 - Age of First Use: 18 1 - Amount (size/oz): Over 12 cans of beer 1 - Frequency: Daily 1 - Duration: Ongoing 1 - Last Use / Amount: 07/09/2022, 3 cans of beer 1 - Method of Aquiring: Store 1- Route of Use: Oral ingestion Name of Substance 2: Cocaine (crack) 2 - Age of First Use: 16 2 - Amount (size/oz): Approximately $50 worth 2 - Frequency: Daily 2 - Duration: Ongoing 2 - Last Use /  Amount: 07/09/2022, $50 worth 2 - Method of Aquiring: Unknown 2 - Route of Substance Use: Smoke inhalation Name of Substance 3: Marijuana 3 - Age of First Use: 16 3 - Amount (size/oz): Varies 3 - Frequency: 2-3 times per week 3 - Duration: Ongoing 3 - Last Use / Amount: 07/07/2022 3 - Method of Aquiring: Unknown 3 - Route of Substance Use: Smoke inhalation              Sleep: Fair  Appetite:  Good  Current Medications:  Current Facility-Administered Medications  Medication Dose Route Frequency Provider Last Rate Last Admin   acetaminophen (TYLENOL) tablet 650 mg  650 mg Oral Q6H PRN Bobbitt, Shalon E, NP       alum & mag hydroxide-simeth (MAALOX/MYLANTA) 200-200-20 MG/5ML suspension 30 mL  30 mL Oral Q4H PRN Bobbitt, Shalon E, NP       hydrOXYzine (ATARAX) tablet 25 mg  25 mg Oral Q6H PRN Bobbitt, Shalon E, NP       loperamide (IMODIUM) capsule 2-4 mg  2-4 mg Oral PRN Bobbitt, Shalon E, NP       LORazepam (ATIVAN) tablet 1 mg  1 mg Oral Q6H PRN Bobbitt, Shalon E, NP       LORazepam (ATIVAN) tablet 1 mg  1 mg Oral QID Bobbitt, Shalon E, NP   1 mg at 07/11/22 3825   Followed by   LORazepam (ATIVAN) tablet 1 mg  1 mg Oral TID Bobbitt, Shalon E, NP       Followed by   Derrill Memo ON 07/12/2022] LORazepam (ATIVAN) tablet 1 mg  1 mg Oral BID Bobbitt, Shalon E, NP       Followed by   Derrill Memo ON 07/14/2022] LORazepam (ATIVAN) tablet 1 mg  1 mg Oral Daily Bobbitt, Shalon E, NP       magnesium hydroxide (MILK OF MAGNESIA) suspension 30 mL  30 mL Oral Daily PRN Bobbitt, Shalon E, NP       multivitamin with minerals tablet 1 tablet  1 tablet Oral Daily Bobbitt, Shalon E, NP   1 tablet at 07/11/22 0821   ondansetron (ZOFRAN-ODT) disintegrating tablet 4 mg  4 mg Oral Q6H PRN Bobbitt, Shalon E, NP       sertraline (ZOLOFT) tablet 25 mg  25 mg Oral Daily Briant Cedar, MD   25 mg at 07/11/22 0539   sodium chloride (OCEAN) 0.65 % nasal spray 1 spray  1 spray Each Nare PRN Briant Cedar, MD       thiamine (VITAMIN B1) tablet 100 mg  100 mg Oral Daily Bobbitt, Shalon E, NP   100 mg at 07/11/22 0821   traZODone (DESYREL) tablet 50 mg  50 mg Oral QHS PRN Bobbitt, Shalon E, NP   50 mg at 07/10/22 2131   No current outpatient medications on file.    Labs  Lab Results:  Admission on 07/09/2022  Component Date Value Ref Range Status   SARS Coronavirus 2 by RT  PCR 07/09/2022 NEGATIVE  NEGATIVE Final   Comment: (NOTE) SARS-CoV-2 target nucleic acids are NOT DETECTED.  The SARS-CoV-2 RNA is generally detectable in upper respiratory specimens during the acute phase of infection. The lowest concentration of SARS-CoV-2 viral copies this assay can detect is 138 copies/mL. A negative result does not preclude SARS-Cov-2 infection and should not be used as the sole basis for treatment or other patient management decisions. A negative result may occur with  improper specimen collection/handling, submission of specimen other than nasopharyngeal swab, presence of viral mutation(s) within the areas targeted by this assay, and inadequate number of viral copies(<138 copies/mL). A negative result must be combined with clinical observations, patient history, and epidemiological information. The expected result is Negative.  Fact Sheet for Patients:  BloggerCourse.comhttps://www.fda.gov/media/152166/download  Fact Sheet for Healthcare Providers:  SeriousBroker.ithttps://www.fda.gov/media/152162/download  This test is no                          t yet approved or cleared by the [place] FDA and  has been authorized for detection and/or diagnosis of SARS-CoV-2 by FDA under an Emergency Use Authorization (EUA). This EUA will remain  in effect (meaning this test can be used) for the duration of the COVID-19 declaration under Section 564(b)(1) of the Act, 21 U.S.C.section 360bbb-3(b)(1), unless the authorization is terminated  or revoked sooner.       Influenza A by PCR 07/09/2022 NEGATIVE  NEGATIVE Final    Influenza B by PCR 07/09/2022 NEGATIVE  NEGATIVE Final   Comment: (NOTE) The Xpert Xpress SARS-CoV-2/FLU/RSV plus assay is intended as an aid in the diagnosis of influenza from Nasopharyngeal swab specimens and should not be used as a sole basis for treatment. Nasal washings and aspirates are unacceptable for Xpert Xpress SARS-CoV-2/FLU/RSV testing.  Fact Sheet for Patients: BloggerCourse.comhttps://www.fda.gov/media/152166/download  Fact Sheet for Healthcare Providers: SeriousBroker.ithttps://www.fda.gov/media/152162/download  This test is not yet approved or cleared by the [place] FDA and has been authorized for detection and/or diagnosis of SARS-CoV-2 by FDA under an Emergency Use Authorization (EUA). This EUA will remain in effect (meaning this test can be used) for the duration of the COVID-19 declaration under Section 564(b)(1) of the Act, 21 U.S.C. section 360bbb-3(b)(1), unless the authorization is terminated or revoked.     Resp Syncytial Virus by PCR 07/09/2022 NEGATIVE  NEGATIVE Final   Comment: (NOTE) Fact Sheet for Patients: BloggerCourse.comhttps://www.fda.gov/media/152166/download  Fact Sheet for Healthcare Providers: SeriousBroker.ithttps://www.fda.gov/media/152162/download  This test is not yet approved or cleared by the [place] FDA and has been authorized for detection and/or diagnosis of SARS-CoV-2 by FDA under an Emergency Use Authorization (EUA). This EUA will remain in effect (meaning this test can be used) for the duration of the COVID-19 declaration under Section 564(b)(1) of the Act, 21 U.S.C. section 360bbb-3(b)(1), unless the authorization is terminated or revoked.  Performed at [facility], [address] [place], [place] [address]    Sodium 07/09/2022 137  135 - 145 mmol/L Final   Potassium 07/09/2022 4.7  3.5 - 5.1 mmol/L Final   Chloride 07/09/2022 101  98 - 111 mmol/L Final   CO2 07/09/2022 25  22 - 32 mmol/L Final   Glucose, Bld 07/09/2022 79  70 - 99 mg/dL Final   Glucose reference  range applies only to samples taken after fasting for at least 8 hours.   BUN 07/09/2022 6  6 - 20 mg/dL Final   Creatinine, Ser 07/09/2022 0.85  0.61 - 1.24  mg/dL Final   Calcium 07/09/2022 9.6  8.9 - 10.3 mg/dL Final   Total Protein 07/09/2022 7.7  6.5 - 8.1 g/dL Final   Albumin 07/09/2022 3.9  3.5 - 5.0 g/dL Final   AST 07/09/2022 38  15 - 41 U/L Final   ALT 07/09/2022 43  0 - 44 U/L Final   Alkaline Phosphatase 07/09/2022 94  38 - 126 U/L Final   Total Bilirubin 07/09/2022 0.7  0.3 - 1.2 mg/dL Final   GFR, Estimated 07/09/2022 >60  >60 mL/min Final   Comment: (NOTE) Calculated using the CKD-EPI Creatinine Equation (2021)    Anion gap 07/09/2022 11  5 - 15 Final   Performed at [facility], [place] [address] [place], [place] [address]   WBC 07/09/2022 8.4  4.0 - 10.5 K/uL Final   RBC 07/09/2022 5.12  4.22 - 5.81 MIL/uL Final   Hemoglobin 07/09/2022 17.0  13.0 - 17.0 g/dL Final   HCT 07/09/2022 48.0  39.0 - 52.0 % Final   MCV 07/09/2022 93.8  80.0 - 100.0 fL Final   MCH 07/09/2022 33.2  26.0 - 34.0 pg Final   MCHC 07/09/2022 35.4  30.0 - 36.0 g/dL Final   RDW 07/09/2022 11.5  11.5 - 15.5 % Final   Platelets 07/09/2022 297  150 - 400 K/uL Final   nRBC 07/09/2022 0.0  0.0 - 0.2 % Final   Neutrophils Relative % 07/09/2022 58  % Final   Neutro Abs 07/09/2022 4.8  1.7 - 7.7 K/uL Final   Lymphocytes Relative 07/09/2022 36  % Final   Lymphs Abs 07/09/2022 3.0  0.7 - 4.0 K/uL Final   Monocytes Relative 07/09/2022 4  % Final   Monocytes Absolute 07/09/2022 0.3  0.1 - 1.0 K/uL Final   Eosinophils Relative 07/09/2022 2  % Final   Eosinophils Absolute 07/09/2022 0.2  0.0 - 0.5 K/uL Final   Basophils Relative 07/09/2022 0  % Final   Basophils Absolute 07/09/2022 0.0  0.0 - 0.1 K/uL Final   Immature Granulocytes 07/09/2022 0  % Final   Abs Immature Granulocytes 07/09/2022 0.02  0.00 - 0.07 K/uL Final   Performed at [facility], [place] [address], [place], [place] [address]    Alcohol, Ethyl (B) 07/09/2022 94 (H)  <10 mg/dL Final   Comment: (NOTE) Lowest detectable limit for serum alcohol is 10 mg/dL.  For medical purposes only. Performed at [facility], [place] [address] [place], [place] [address]    Cholesterol 07/09/2022 218 (H)  0 - 200 mg/dL Final   Triglycerides 07/09/2022 88  <150 mg/dL Final   HDL 07/09/2022 94  >40 mg/dL Final   Total CHOL/HDL Ratio 07/09/2022 2.3  RATIO Final   VLDL 07/09/2022 18  0 - 40 mg/dL Final   LDL Cholesterol 07/09/2022 106 (H)  0 - 99 mg/dL Final   Comment:        Total Cholesterol/HDL:CHD Risk Coronary Heart Disease Risk Table                     Men   Women  1/2 Average Risk   3.4   3.3  Average Risk       5.0   4.4  2 X Average Risk   9.6   7.1  3 X Average Risk  23.4   11.0        Use the calculated Patient Ratio above and the CHD Risk Table to determine the patient's  CHD Risk.        ATP III CLASSIFICATION (LDL):  <100     mg/dL   Optimal  536-144  mg/dL   Near or Above                    Optimal  130-159  mg/dL   Borderline  315-400  mg/dL   High  >867     mg/dL   Very High Performed at [facility], [address], [place], [place] [address]    POC Amphetamine UR 07/09/2022 None Detected  NONE DETECTED (Cut Off Level 1000 ng/mL) Final   POC Secobarbital (BAR) 07/09/2022 None Detected  NONE DETECTED (Cut Off Level 300 ng/mL) Final   POC Buprenorphine (BUP) 07/09/2022 None Detected  NONE DETECTED (Cut Off Level 10 ng/mL) Final   POC Oxazepam (BZO) 07/09/2022 None Detected  NONE DETECTED (Cut Off Level 300 ng/mL) Final   POC Cocaine UR 07/09/2022 Positive (A)  NONE DETECTED (Cut Off Level 300 ng/mL) Final   POC Methamphetamine UR 07/09/2022 None Detected  NONE DETECTED (Cut Off Level 1000 ng/mL) Final   POC Morphine 07/09/2022 Positive (A)  NONE DETECTED (Cut Off Level 300 ng/mL) Final   POC Methadone UR 07/09/2022 None Detected  NONE DETECTED (Cut Off Level 300 ng/mL) Final   POC Oxycodone UR  07/09/2022 None Detected  NONE DETECTED (Cut Off Level 100 ng/mL) Final   POC Marijuana UR 07/09/2022 Positive (A)  NONE DETECTED (Cut Off Level 50 ng/mL) Final   SARSCOV2ONAVIRUS 2 AG 07/09/2022 NEGATIVE  NEGATIVE Final   Comment: (NOTE) SARS-CoV-2 antigen NOT DETECTED.   Negative results are presumptive.  Negative results do not preclude SARS-CoV-2 infection and should not be used as the sole basis for treatment or other patient management decisions, including infection  control decisions, particularly in the presence of clinical signs and  symptoms consistent with COVID-19, or in those who have been in contact with the virus.  Negative results must be combined with clinical observations, patient history, and epidemiological information. The expected result is Negative.  Fact Sheet for Patients: https://www.jennings-kim.com/  Fact Sheet for Healthcare Providers: https://alexander-rogers.biz/  This test is not yet approved or cleared by the [place] FDA and  has been authorized for detection and/or diagnosis of SARS-CoV-2 by FDA under an Emergency Use Authorization (EUA).  This EUA will remain in effect (meaning this test can be used) for the duration of  the COV                          ID-19 declaration under Section 564(b)(1) of the Act, 21 U.S.C. section 360bbb-3(b)(1), unless the authorization is terminated or revoked sooner.      Blood Alcohol level:  Lab Results  Component Value Date   ETH 94 (H) 07/09/2022    Metabolic Disorder Labs: No results found for: "HGBA1C", "MPG" No results found for: "PROLACTIN" Lab Results  Component Value Date   CHOL 218 (H) 07/09/2022   TRIG 88 07/09/2022   HDL 94 07/09/2022   CHOLHDL 2.3 07/09/2022   VLDL 18 07/09/2022   LDLCALC 106 (H) 07/09/2022    Therapeutic Lab Levels: No results found for: "LITHIUM" No results found for: "VALPROATE" No results found for: "CBMZ"  Physical Findings    PHQ2-9    Flowsheet Row ED from 07/09/2022 in [facility]  PHQ-2 Total Score 5  PHQ-9 Total Score 23  Flowsheet Row ED from 07/09/2022 in [facility]  C-SSRS RISK CATEGORY No Risk        Musculoskeletal  Strength & Muscle Tone: within normal limits Gait & Station:  laying in bed during interview Patient leans: N/A  Psychiatric Specialty Exam: Physical Exam Constitutional:      Appearance: the patient is not toxic-appearing.  Pulmonary:     Effort: Pulmonary effort is normal.  Neurological:     General: No focal deficit present.     Mental Status: the patient is alert and oriented to person, place, and time.   Review of Systems  Respiratory:  Negative for shortness of breath.   Cardiovascular:  Negative for chest pain.  Gastrointestinal:  Negative for abdominal pain, constipation, diarrhea, nausea and vomiting.  Neurological:  Negative for headaches.      BP (!) 140/80   Pulse 78   Temp 98.3 F (36.8 C) (Tympanic)   Resp 16   SpO2 99%   General Appearance: Fairly Groomed  Eye Contact:  Good  Speech:  Clear and Coherent  Volume:  Normal  Mood:  Euthymic  Affect:  Congruent  Thought Process:  Coherent  Orientation:  Full (Time, Place, and Person)  Thought Content: Logical   Suicidal Thoughts:  No  Homicidal Thoughts:  No  Memory:  Immediate;   Good  Judgement:  fair  Insight:  fair  Psychomotor Activity:  Normal  Concentration:  Concentration: Good  Recall:  Good  Fund of Knowledge: Good  Language: Good  Akathisia:  No  Handed:    AIMS (if indicated): not done  Assets:  Communication Skills Desire for Improvement Financial Resources/Insurance Housing Leisure Time Physical Health  ADL's:  Intact  Cognition: WNL  Sleep:  Fair     Treatment Plan Summary: Daily contact with patient to assess and evaluate symptoms and progress in treatment and Medication management  Thorne Wirz  is a 47 yr old male who presented on 1/14 to [facility] requesting substance use treatment for EtOH and Crack Cocaine, he was admitted to [facility] on 1/13.  PPHx is significant for Polysubstance Abuse (EtOH, Crack Cocaine) and 1 Accidental Overdose (6/21), and no history of Suicide Attempts, Self Injurious Behavior, or Psychiatric Hospitalizations.  Depression  GAD: -Continue Zoloft 25 mg daily for depression, started 1/14   Withdrawal: -Continue CIWA -Change Ativan taper to Librium, given continued withdrawal symptoms -Continue Ativan 1 mg q6 PRN CIWA>10 -Continue Imodium 2-4 mg PRN diarrhea -Continue Zofran-ODT 4 mg q6 PRN nausea -Continue Thiamine 100 mg daily for nutritional supplementation -Continue Multivitamin daily for nutritional supplementation   Congestion: -Start NaCl nasal spray 1 per nare PRN   -Continue PRN's: Tylenol, Maalox, Atarax, Milk of Magnesia, Trazodone    Carlyn Reichert, MD 07/11/2022 12:06 PM

## 2022-07-11 NOTE — ED Notes (Signed)
Patient was resting quietly in bed for most of the afternoon, no S/S of distress. Will continue to monitor for safety.

## 2022-07-12 DIAGNOSIS — F1290 Cannabis use, unspecified, uncomplicated: Secondary | ICD-10-CM | POA: Diagnosis not present

## 2022-07-12 DIAGNOSIS — Z1152 Encounter for screening for COVID-19: Secondary | ICD-10-CM | POA: Diagnosis not present

## 2022-07-12 DIAGNOSIS — F1410 Cocaine abuse, uncomplicated: Secondary | ICD-10-CM | POA: Diagnosis not present

## 2022-07-12 NOTE — ED Notes (Signed)
Pt is in the bed sleeping. Respirations are even and unlabored. No acute distress noted. Will continue to monitor for safety. 

## 2022-07-12 NOTE — ED Notes (Signed)
Patient alert and oriented x 3. Denies SI/HI/AVH. Denies intent or plan to harm self or others. Routine conducted according to faculty protocol. Encourage patient to notify staff with any needs or concerns. Patient verbalized agreement and understanding. Will continue to monitor for safety. 

## 2022-07-12 NOTE — ED Notes (Signed)
Pt reported to nurse that he is having difficulty falling asleep.

## 2022-07-12 NOTE — ED Notes (Signed)
Patient  sleeping in no acute stress. RR even and unlabored .Environment secured .Will continue to monitor for safely. 

## 2022-07-12 NOTE — ED Notes (Signed)
Patient is  resting in no acute stress. RR even and unlabored .Environment secured .Will continue to monitor for safely. 

## 2022-07-12 NOTE — Tx Team (Signed)
LCSW attempted to speak with patient on yesterday, however was only able to get limited information about him and his reason for admission. Per chart, "Pt is a 47 year old single male who present to [facility] accompanied by his friends, [name] and [name] [phone], who participated in assessment at Pt's request. Pt is requesting substance abuse treatment. He states he has been using alcohol, cocaine, and marijuana since adolescence and cannot remember a day when he did not use some substance. He states he is using alcohol and crack daily and smoking marijuana 2-3 times per week (see details below). He acknowledges experiencing alcohol withdrawal when he stops drinking including tremors, sweats, and nausea; he denies history of seizures. He says he has never sought substance abuse treatment before and is seeking treatment today with the encouragement of his friends. He describes his mood as "down" and Pt acknowledges symptoms including social withdrawal, loss of interest in usual pleasures, fatigue, irritability, decreased concentration, decreased sleep, decreased appetite and feelings of worthlessness and hopelessness. He states he feels lonely. He says he has experienced suicidal thoughts in the past with no plan or intent. He denies current suicidal ideation or history of suicide attempts. Pt denies any history of intentional self-injurious behaviors. Pt denies current homicidal ideation or history of violence. Pt denies any history of auditory or visual hallucinations. Pt identifies several stressors. He describes being homeless for the past 7-8 years and is currently staying [place] a bridge. 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 He states he has an eighth grade education. He says he was raised in the foster care system and cannot identify any family support. He identifies [name] and Joanna Hews as his primary support. He has charges pending in [employer] for petty larceny and failure to appear in court. He denies history of abuse. He denies access to firearms. He denies any history of mental health or substance abuse treatment".  Patient informed this Clinician that he is interested in residential placement at this time for substance use. Patient asked about [facility] and was informed that a referral can be sent out for review. Patient expressed interest and is aware that LCSW will also provide resources for local [facility] and shelters within the area as well. Patient was appreciative of LCSW assistance. No other needs were reported at this time.   Referral has been sent to [facility] for review. Per Enis Gash, once reviewed she will follow up to provide an update.   Lucius Conn, LCSW Clinical Social Worker [facility] Ph: [phone]

## 2022-07-12 NOTE — ED Notes (Addendum)
Patient refused to come to group. 

## 2022-07-12 NOTE — ED Notes (Signed)
Patient is watching tv in the dayroom. Alert and oriented. Will continue to monitor for safety.
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
## 2022-07-12 NOTE — ED Notes (Signed)
Patient refused to come to group.

## 2022-07-13 ENCOUNTER — Encounter (HOSPITAL_COMMUNITY): Payer: Self-pay | Admitting: Nurse Practitioner

## 2022-07-13 DIAGNOSIS — F1410 Cocaine abuse, uncomplicated: Secondary | ICD-10-CM | POA: Diagnosis not present

## 2022-07-13 DIAGNOSIS — Z1152 Encounter for screening for COVID-19: Secondary | ICD-10-CM | POA: Diagnosis not present

## 2022-07-13 DIAGNOSIS — F1290 Cannabis use, unspecified, uncomplicated: Secondary | ICD-10-CM | POA: Diagnosis not present

## 2022-07-13 MED ORDER — SERTRALINE HCL 25 MG PO TABS: 25.0000 mg | ORAL_TABLET | Freq: Every day | ORAL | 0 refills | Status: DC

## 2022-07-13 MED ORDER — TRAZODONE HCL 50 MG PO TABS: 50.0000 mg | ORAL_TABLET | Freq: Every evening | ORAL | 0 refills | Status: DC | PRN

## 2022-07-13 NOTE — Discharge Planning (Signed)
Referral was received and per Enis Gash, patient has been accepted and can transfer to the facility on tomorrow 07/14/2022 by 9:00am. Update has been provided to the patient and MD made aware. Patient will need a 14-30 day supply of medication and one month refill. No nicotine gum allowed, however 14-30 day nicotine patches to be provided if needed. No other needs to report at this time.    LCSW will continue to follow up and provide updates as received.    Lucius Conn, LCSW Clinical Social Worker [place] [facility] Ph: [phone]

## 2022-07-13 NOTE — ED Notes (Signed)
Pt sleeping in no acute distress. RR even and unlabored. Environment secured. Will continue to monitor for safety. 

## 2022-07-13 NOTE — ED Notes (Signed)
Patient A&Ox4. Denies intent to harm self/others when asked. Denies A/VH. Patient denies any physical complaints when asked. No acute distress noted. Flat affect. Support and encouragement provided. Routine safety checks conducted according to facility protocol. Encouraged patient to notify staff if thoughts of harm toward self or others arise. Patient verbalize understanding and agreement. Will continue to monitor for safety.

## 2022-07-13 NOTE — Discharge Planning (Signed)
LCSW attempted to get in contact with Admissions Coordinator Enis Gash at [facility] [phone] ext [phone] regarding referral, however received no answer. LCSW attempted to contact Admissions RN Rolena Infante at extension [phone] to follow up, however received no answer. Voice message was left at 8:13am requesting phone call back regarding referral and if patient was accepted. LCSW will continue to follow and provide updates as received. Patient made aware of current plan and has been encouraged to continue making calls regarding other residential possibilities.   Lucius Conn, LCSW Clinical Social Worker [place] [facility] Ph: [phone]

## 2022-07-13 NOTE — [facility] Group Notes (Signed)
[place] LCSW Group Therapy Note   Date/Time: 07/13/2022 at 1:30pm   Type of Therapy/Topic:  Group Therapy:  Balance in Life   Participation Level:  Active   Description of Group:   This group will address the importance of considering the journey and not just the destination. Patients will be encouraged to process areas in their lives where they found it hard to focus on the good rather than the bad, and identify reasons for maintaining such thought pattern. Facilitator will guide patients utilizing problem- solving interventions to address and improve the way each patient views themselves and their situation. Patients will work through understanding and applying humility when it comes to life changes and the interactions we have with others. Patients will be encouraged to explore ways that they will move forward throughout life's journey, and make healthier decisions for themselves.    Therapeutic Goals: Patient will identify two or more emotions or situations that they observed or felt throughout watching the video clip. Patient will identify signs where they may have responded to someone or situation due to their current circumstance.  Patient will identify two ways to set better habits in order to achieve more peace in their lives. Patient will demonstrate ability to communicate their needs through discussion and/or role plays.   Summary of Patient Progress: Patient actively participated in group on today. Patient reports that the video clip definitely made him reflect on the importance of being considerate of others as you never know what someone else is facing or being challenged with. Patient reports feeling encouraged from watching the video, and his was able to provide feedback to staff and peers.      Therapeutic Modalities:   Cognitive Behavioral Therapy Solution-Focused Therapy Assertiveness Training   Lucius Conn, [place] Clinical Social Worker [facility] Ph:  [phone]

## 2022-07-13 NOTE — ED Provider Notes (Addendum)
[facility]/OBS ASAP Discharge Summary  Date and Time: 07/13/2022 12:04 PM  Name: Juan Hood  MRN:  [id number]   Discharge Diagnoses:  Final diagnoses:  Alcohol use disorder  Cocaine use disorder ([place])    Subjective:  Juan Hood is a 47 yr old male who presented on 1/14 to [facility] requesting substance use treatment for EtOH and Crack Cocaine, he was admitted to [facility] on 1/13.  PPHx is significant for Polysubstance Abuse (EtOH, Crack Cocaine) and 1 Accidental Overdose (6/21), and no history of Suicide Attempts, Self Injurious Behavior, or Psychiatric Hospitalizations.  He is currently homeless and has no social support.  Stay Summary:  The patient completed detox but did experience mild to moderate alcohol withdrawal symptoms with a Librium taper.  The patient was found placement at [facility] residential and discharged to 1/18.   The patient's PHQ-9 scores were elevated, but on the day prior to discharge he reported a significant improvement in his mood and affect given that he had secured placement at a residential rehab.  He consistently denied experiencing suicidal thoughts and exhibited appropriate affect.  He engaged well in group therapy.  Total Time spent with patient: 30 minutes  Past Psychiatric History: as above Past Medical History:  Past Medical History:  Diagnosis Date   Substance abuse ([place])    No past surgical history on file. Family History:  Family History  Family history unknown: Yes   Family Psychiatric History: none Social History:  Social History   Substance and Sexual Activity  Alcohol Use Yes     Social History   Substance and Sexual Activity  Drug Use Yes   Types: Cocaine, Marijuana    Social History   Socioeconomic History   Marital status: Single    Spouse name: Not on file   Number of children: Not on file   Years of education: Not on file   Highest education level: Not on file  Occupational History   Not on file  Tobacco Use   Smoking  status: Every Day    Types: Cigarettes   Smokeless tobacco: Never  Substance and Sexual Activity   Alcohol use: Yes   Drug use: Yes    Types: Cocaine, Marijuana   Sexual activity: Yes    Birth control/protection: Condom  Other Topics Concern   Not on file  Social History Narrative   Not on file   Social Determinants of Health   Financial Resource Strain: Not on file  Food Insecurity: Not on file  Transportation Needs: Not on file  Physical Activity: Not on file  Stress: Not on file  Social Connections: Not on file   SDOH:  SDOH Screenings   Depression (PHQ2-9): High Risk (07/12/2022)  Tobacco Use: High Risk (07/09/2020)    Tobacco Cessation:  A prescription for an FDA-approved tobacco cessation medication provided at discharge  Current Medications:  Current Facility-Administered Medications  Medication Dose Route Frequency Provider Last Rate Last Admin   acetaminophen (TYLENOL) tablet 650 mg  650 mg Oral Q6H PRN Bobbitt, Shalon E, NP       alum & mag hydroxide-simeth (MAALOX/MYLANTA) 200-200-20 MG/5ML suspension 30 mL  30 mL Oral Q4H PRN Bobbitt, Shalon E, NP       chlordiazePOXIDE (LIBRIUM) capsule 25 mg  25 mg Oral Leonard Schwartz, MD   25 mg at 07/13/22 5701   Followed by   Derrill Memo ON 07/14/2022] chlordiazePOXIDE (LIBRIUM) capsule 25 mg  25 mg Oral Daily Corky Sox, MD  magnesium hydroxide (MILK OF MAGNESIA) suspension 30 mL  30 mL Oral Daily PRN Bobbitt, Shalon E, NP       multivitamin with minerals tablet 1 tablet  1 tablet Oral Daily Bobbitt, Shalon E, NP   1 tablet at 07/13/22 0925   sertraline (ZOLOFT) tablet 25 mg  25 mg Oral Daily Briant Cedar, MD   25 mg at 07/13/22 0900   sodium chloride (OCEAN) 0.65 % nasal spray 1 spray  1 spray Each Nare PRN Briant Cedar, MD       thiamine (VITAMIN B1) tablet 100 mg  100 mg Oral Daily Bobbitt, Shalon E, NP   100 mg at 07/13/22 0925   traZODone (DESYREL) tablet 50 mg  50 mg Oral QHS PRN  Bobbitt, Shalon E, NP   50 mg at 07/12/22 2341   Current Outpatient Medications  Medication Sig Dispense Refill   [START ON 07/14/2022] sertraline (ZOLOFT) 25 MG tablet Take 1 tablet (25 mg total) by mouth daily. 30 tablet 0   traZODone (DESYREL) 50 MG tablet Take 1 tablet (50 mg total) by mouth at bedtime as needed for sleep. 30 tablet 0    PTA Medications: (Not in a hospital admission)      07/12/2022    8:01 AM 07/10/2022    7:30 AM 07/09/2022   11:19 PM  Depression screen PHQ 2/9  Decreased Interest 2 2 1   Down, Depressed, Hopeless 3 3 1   PHQ - 2 Score 5 5 2   Altered sleeping 3 1 1   Tired, decreased energy 2 3 1   Change in appetite 2 3 0  Feeling bad or failure about yourself  3 3 1   Trouble concentrating 2 2 1   Moving slowly or fidgety/restless 1 3 0  Suicidal thoughts 1 3 0  PHQ-9 Score 19 23 6   Difficult doing work/chores Somewhat difficult Very difficult Somewhat difficult    Flowsheet Row ED from 07/09/2022 in [facility] No Risk       Musculoskeletal  Strength & Muscle Tone: within normal limits Gait & Station: normal Patient leans: N/A  Psychiatric Specialty Exam: Physical Exam Constitutional:      Appearance: the patient is not toxic-appearing.  Pulmonary:     Effort: Pulmonary effort is normal.  Neurological:     General: No focal deficit present.     Mental Status: the patient is alert and oriented to person, place, and time.   Review of Systems  Respiratory:  Negative for shortness of breath.   Cardiovascular:  Negative for chest pain.  Gastrointestinal:  Negative for abdominal pain, constipation, diarrhea, nausea and vomiting.  Neurological:  Negative for headaches.      BP 117/80 (BP Location: Right Arm)   Pulse 75   Temp (!) 97.5 F (36.4 C) (Tympanic)   Resp 18   SpO2 100%   General Appearance: Fairly Groomed  Eye Contact:  Good  Speech:  Clear and Coherent  Volume:  Normal  Mood:   Euthymic  Affect:  Congruent  Thought Process:  Coherent  Orientation:  Full (Time, Place, and Person)  Thought Content: Logical   Suicidal Thoughts:  No  Homicidal Thoughts:  No  Memory:  Immediate;   Good  Judgement:  fair  Insight:  fair  Psychomotor Activity:  Normal  Concentration:  Concentration: Good  Recall:  Good  Fund of Knowledge: Good  Language: Good  Akathisia:  No  Handed:    AIMS (if  indicated): not done  Assets:  Communication Skills Desire for Improvement Financial Resources/Insurance Housing Leisure Time Physical Health  ADL's:  Intact  Cognition: WNL  Sleep:  Fair     Demographic Factors:  Male  Loss Factors: NA  Historical Factors: NA  Risk Reduction Factors:   Positive therapeutic relationship and Positive coping skills or problem solving skills  Continued Clinical Symptoms:  Alcohol/Substance Abuse/Dependencies  Cognitive Features That Contribute To Risk:  None    Suicide Risk:  Patient presented with no identifiable suicidal ideation.  He is engaged with this treatment plan and has good follow-up arranged.  Plan Of Care/Follow-up recommendations:  Activity as tolerated. Diet as recommended by PCP. Keep all scheduled follow-up appointments as recommended.  Patient is instructed to take all prescribed medications as recommended. Report any side effects or adverse reactions to your outpatient psychiatrist. Patient is instructed to abstain from alcohol and illegal drugs while on prescription medications. In the event of worsening symptoms, patient is instructed to call the crisis hotline, 911, or go to the nearest emergency department for evaluation and treatment.  Prescriptions given at discharge. Patient agreeable to plan. Given opportunity to ask questions. Appears to feel comfortable with discharge.  Patient is also instructed prior to discharge to: Take all medications as prescribed by mental healthcare provider. Report any adverse  effects and or reactions from the medicines to outpatient provider promptly. Patient has been instructed & cautioned: To not engage in alcohol and or illegal drug use while on prescription medicines. In the event of worsening symptoms,  patient is instructed to call the crisis hotline, 911 and or go to the nearest ED for appropriate evaluation and treatment of symptoms. To follow-up with primary care provider for other medical issues, concerns and or health care needs  The patient was evaluated each day by a clinical provider to ascertain response to treatment. Improvement was noted by the patient's report of decreasing symptoms, improved sleep and appetite, affect, medication tolerance, behavior, and participation in unit programming.  Patient was asked each day to complete a self inventory noting mood, mental status, pain, new symptoms, anxiety and concerns.  Patient responded well to medication and being in a therapeutic and supportive environment. Positive and appropriate behavior was noted and the patient was motivated for recovery. The patient worked closely with the treatment team and case manager to develop a discharge plan with appropriate goals. Coping skills, problem solving as well as relaxation therapies were also part of the unit programming.  By the day of discharge patient was in much improved condition than upon admission.  Symptoms were reported as significantly decreased or resolved completely. The patient was motivated to continue taking medication with a goal of continued improvement in mental health.    Disposition: [facility] residential  Carlyn Reichert, MD 07/13/2022, 12:04 PM

## 2022-07-13 NOTE — ED Notes (Signed)
Pt is in the bed sleeping. Respirations are even and unlabored. No acute distress noted. Will continue to monitor for safety. 

## 2022-07-13 NOTE — ED Notes (Addendum)
Pt currently on phone speaking with family. No acute distress noted. Safety maintained.

## 2022-07-13 NOTE — ED Notes (Signed)
Patient resting quietly in bed with eyes closed, Respirations equal and unlabored, skin warm and dry, NAD. Routine safety checks conducted according to facility protocol. Will continue to monitor for safety. 

## 2022-07-13 NOTE — Discharge Instructions (Signed)

## 2022-07-14 DIAGNOSIS — Z1152 Encounter for screening for COVID-19: Secondary | ICD-10-CM | POA: Diagnosis not present

## 2022-07-14 DIAGNOSIS — F1290 Cannabis use, unspecified, uncomplicated: Secondary | ICD-10-CM | POA: Diagnosis not present

## 2022-07-14 DIAGNOSIS — F1410 Cocaine abuse, uncomplicated: Secondary | ICD-10-CM | POA: Diagnosis not present

## 2022-09-26 ENCOUNTER — Ambulatory Visit: Payer: Medicaid Other | Admitting: Physician Assistant

## 2022-09-26 ENCOUNTER — Encounter: Payer: Self-pay | Admitting: Physician Assistant

## 2022-09-26 VITALS — BP 132/91 | HR 74 | Ht 64.0 in | Wt 154.0 lb

## 2022-09-26 DIAGNOSIS — F17210 Nicotine dependence, cigarettes, uncomplicated: Secondary | ICD-10-CM

## 2022-09-26 DIAGNOSIS — Z114 Encounter for screening for human immunodeficiency virus [HIV]: Secondary | ICD-10-CM

## 2022-09-26 DIAGNOSIS — H00014 Hordeolum externum left upper eyelid: Secondary | ICD-10-CM

## 2022-09-26 DIAGNOSIS — Z720 Tobacco use: Secondary | ICD-10-CM

## 2022-09-26 DIAGNOSIS — B1920 Unspecified viral hepatitis C without hepatic coma: Secondary | ICD-10-CM

## 2022-09-26 DIAGNOSIS — F1410 Cocaine abuse, uncomplicated: Secondary | ICD-10-CM

## 2022-09-26 DIAGNOSIS — F1010 Alcohol abuse, uncomplicated: Secondary | ICD-10-CM | POA: Diagnosis not present

## 2022-09-26 DIAGNOSIS — Z9189 Other specified personal risk factors, not elsewhere classified: Secondary | ICD-10-CM

## 2022-09-26 DIAGNOSIS — Z1159 Encounter for screening for other viral diseases: Secondary | ICD-10-CM | POA: Diagnosis not present

## 2022-09-26 DIAGNOSIS — F411 Generalized anxiety disorder: Secondary | ICD-10-CM

## 2022-09-26 NOTE — Patient Instructions (Signed)
To help with your stye, you are going to apply warm compresses to your eye for 5 to 10 minutes, 4-6 times a day.  Do not try to pop or drain the stye, do not rub your eye.    We will call you with today's lab results  Kennieth Rad, PA-C Physician Assistant [place] [email or web address]    Stye A stye, also known as a hordeolum, is a bump that forms on an eyelid. It may look like a pimple next to the eyelash. A stye can form inside the eyelid (internal stye) or outside the eyelid (external stye). A stye can cause redness, swelling, and pain on the eyelid. Styes are very common. Anyone can get them at any age. They usually occur in just one eye at a time, but you may have more than one in either eye. What are the causes? A stye is caused by an infection. The infection is almost always caused by bacteria called Staphylococcus aureus. This is a common type of bacteria that lives on the skin. An internal stye may result from an infected oil-producing gland inside the eyelid. An external stye may be caused by an infection at the base of the eyelash (hair follicle). What increases the risk? You are more likely to develop a stye if: You have had a stye before. You have any of these conditions: Red, itchy, inflamed eyelids (blepharitis). A skin condition such as seborrheic dermatitis or rosacea. High fat levels in your blood (lipids). Dry eyes. What are the signs or symptoms? The most common symptom of a stye is eyelid pain. Internal styes are more painful than external styes. Other symptoms may include: Painful swelling of your eyelid. A scratchy feeling in your eye. Tearing and redness of your eye. A pimple-like bump on the edge of the eyelid. Pus draining from the stye. How is this diagnosed? Your health care provider may be able to diagnose a stye just by examining your eye. The health care provider may also check to make  sure: You do not have a fever or other signs of a more serious infection. The infection has not spread to other parts of your eye or areas around your eye. How is this treated? Most styes will clear up in a few days without treatment or with warm compresses applied to the area. You may need to use antibiotic drops or ointment to treat an infection. Sometimes, steroid drops or ointment are used in addition to antibiotics. In some cases, your health care provider may give you a small steroid injection in the eyelid. If your stye does not heal with routine treatment, your health care provider may drain pus from the stye using a thin blade or needle. This may be done if the stye is large, causing a lot of pain, or affecting your vision. Follow these instructions at home: Take over-the-counter and prescription medicines only as told by your health care provider. This includes eye drops or ointments. If you were prescribed an antibiotic medicine, steroid medicine, or both, apply or use them as told by your health care provider. Do not stop using the medicine even if your condition improves. Apply a warm, wet cloth (warm compress) to your eye for 5-10 minutes, 4 to 6 times a day. Clean the affected eyelid as directed by your health care provider. Do not wear contact lenses or eye makeup until your stye has healed and your health care provider says that it is safe. Do not try  to pop or drain the stye. Do not rub your eye. Contact a health care provider if: You have chills or a fever. Your stye does not go away after several days. Your stye affects your vision. Your eyeball becomes swollen, red, or painful. Get help right away if: You have pain when moving your eye around. Summary A stye is a bump that forms on an eyelid. It may look like a pimple next to the eyelash. A stye can form inside the eyelid (internal stye) or outside the eyelid (external stye). A stye can cause redness, swelling, and pain  on the eyelid. Your health care provider may be able to diagnose a stye just by examining your eye. Apply a warm, wet cloth (warm compress) to your eye for 5-10 minutes, 4 to 6 times a day. This information is not intended to replace advice given to you by your health care provider. Make sure you discuss any questions you have with your health care provider. Document Revised: 08/19/2020 Document Reviewed: 08/19/2020 Elsevier Patient Education  [place].

## 2022-09-26 NOTE — Progress Notes (Signed)
New Patient Office Visit  Subjective    Patient ID: Juan Hood, male    DOB: [birth year]  Age: 47 y.o. MRN: [id number]  CC:  Chief Complaint  Patient presents with   Stye    Left eye    Exposure to STD    Hep C positive, needs treatment information     HPI Juan Hood states that he is currently being treated for substance abuse at [facility] residential treatment center, states that he arrived January 18.  States that he is transitioning soon to long-term care in either [place] or [place].  States that he has a tender bump on his left upper eyelid, states it has been present for the past 2 days.  States that he has used a warm compress once or twice without much relief.  Denies drainage, denies blurry vision.  States that he feels his anxiety levels have been elevated, states that he is unsure if it is situational or if his medication is not offering relief.  States that he only takes the propranolol once daily in the morning, has not tried taking it more often.  States sleep is good  States that he had a blood test that stated he was positive for hepatitis C antibody.  Denies previous treatment   Outpatient Encounter Medications as of 09/26/2022  Medication Sig   propranolol (INDERAL) 10 MG tablet Take 10 mg by mouth 3 (three) times daily as needed.   sertraline (ZOLOFT) 25 MG tablet Take 1 tablet (25 mg total) by mouth daily.   traZODone (DESYREL) 50 MG tablet Take 1 tablet (50 mg total) by mouth at bedtime as needed for sleep.   No facility-administered encounter medications on file as of 09/26/2022.    Past Medical History:  Diagnosis Date   Substance abuse     History reviewed. No pertinent surgical history.  Family History  Family history unknown: Yes    Social History   Socioeconomic History   Marital status: Single    Spouse name: Not on file   Number of children: Not on file   Years of education: Not on file   Highest education level: Not on  file  Occupational History   Not on file  Tobacco Use   Smoking status: Every Day    Types: Cigarettes   Smokeless tobacco: Never  Substance and Sexual Activity   Alcohol use: Yes   Drug use: Yes    Types: Cocaine, Marijuana   Sexual activity: Yes    Birth control/protection: Condom  Other Topics Concern   Not on file  Social History Narrative   Not on file   Social Determinants of Health   Financial Resource Strain: Not on file  Food Insecurity: Not on file  Transportation Needs: Not on file  Physical Activity: Not on file  Stress: Not on file  Social Connections: Not on file  Intimate Partner Violence: Not on file    Review of Systems  Constitutional: Negative.   HENT: Negative.    Eyes: Negative.   Respiratory:  Negative for shortness of breath.   Cardiovascular:  Negative for chest pain.  Gastrointestinal: Negative.   Genitourinary: Negative.   Musculoskeletal: Negative.   Skin: Negative.   Neurological: Negative.   Endo/Heme/Allergies: Negative.   Psychiatric/Behavioral: Negative.          Objective    BP (!) 132/91 (BP Location: Left Arm, Patient Position: Sitting, Cuff Size: Normal)   Pulse 74   Ht 5'  4" (1.626 m)   Wt 154 lb (69.9 kg)   SpO2 97%   BMI 26.43 kg/m   Physical Exam Vitals and nursing note reviewed.  Constitutional:      Appearance: Normal appearance.  HENT:     Head: Normocephalic and atraumatic.     Right Ear: External ear normal.     Left Ear: External ear normal.     Nose: Nose normal.     Mouth/Throat:     Mouth: Mucous membranes are moist.     Pharynx: Oropharynx is clear.  Eyes:     Extraocular Movements: Extraocular movements intact.     Conjunctiva/sclera: Conjunctivae normal.     Pupils: Pupils are equal, round, and reactive to light.  Cardiovascular:     Rate and Rhythm: Normal rate and regular rhythm.     Pulses: Normal pulses.     Heart sounds: Normal heart sounds.  Pulmonary:     Effort: Pulmonary effort  is normal.     Breath sounds: Normal breath sounds.  Musculoskeletal:        General: Normal range of motion.     Cervical back: Normal range of motion and neck supple.  Skin:    General: Skin is warm and dry.  Neurological:     General: No focal deficit present.     Mental Status: He is alert and oriented to person, place, and time.  Psychiatric:        Mood and Affect: Mood normal.        Behavior: Behavior normal.        Thought Content: Thought content normal.        Judgment: Judgment normal.         Assessment & Plan:   Problem List Items Addressed This Visit       Other   Cocaine abuse   Alcohol abuse   Tobacco abuse   GAD (generalized anxiety disorder) - Primary   Other Visit Diagnoses     Hordeolum externum of left upper eyelid       Encounter for HCV screening test for high risk patient       Relevant Orders   HCV Ab w Reflex to Quant PCR   Screening for HIV (human immunodeficiency virus)       Relevant Orders   HIV antibody (with reflex)      1. GAD (generalized anxiety disorder) Encouraged patient to do trial of increasing dosing of propranolol, is able to take this 3 times a day is only taking once daily.  No refill needed today  Patient encouraged to maintain follow-up with medical provider or return to mobile unit as needed  2. Hordeolum externum of left upper eyelid Patient education given on supportive care, red flags given for prompt reevaluation  3. Cocaine abuse Currently in substance abuse treatment program  4. Alcohol abuse   5. Tobacco abuse   6. Encounter for HCV screening test for high risk patient  - HCV Ab w Reflex to Quant PCR  7. Screening for HIV (human immunodeficiency virus)  - HIV antibody (with reflex)  I have reviewed the patient's medical history (PMH, PSH, Social History, Family History, Medications, and allergies) , and have been updated if relevant. I spent 30 minutes reviewing chart and  face to face time  with patient.    Return if symptoms worsen or fail to improve.   Loraine Grip Mayers, PA-C

## 2022-09-29 LAB — HCV RT-PCR, QUANT (NON-GRAPH)
HCV log10: 5.037 log10 IU/mL
Hepatitis C Quantitation: 109000 IU/mL

## 2022-09-29 LAB — HCV AB W REFLEX TO QUANT PCR: HCV Ab: REACTIVE — AB

## 2022-09-29 LAB — HIV ANTIBODY (ROUTINE TESTING W REFLEX): HIV Screen 4th Generation wRfx: NONREACTIVE

## 2022-09-29 NOTE — Addendum Note (Signed)
Addended by: Kennieth Rad on: 09/29/2022 09:05 AM   Modules accepted: Orders

## 2024-02-05 ENCOUNTER — Other Ambulatory Visit (HOSPITAL_COMMUNITY)
Admission: EM | Admit: 2024-02-05 | Discharge: 2024-02-13 | Disposition: A | Discharge status: Home / Self Care | Payer: MEDICAID | Source: Intra-hospital | Attending: Psychiatry | Admitting: Psychiatry

## 2024-02-05 ENCOUNTER — Ambulatory Visit (HOSPITAL_COMMUNITY)
Admission: EM | Admit: 2024-02-05 | Discharge: 2024-02-05 | Disposition: A | Discharge status: Other Acute Inpatient Hospital | Payer: MEDICAID | Attending: Licensed Clinical Social Worker | Admitting: Licensed Clinical Social Worker

## 2024-02-05 DIAGNOSIS — R4781 Slurred speech: Secondary | ICD-10-CM | POA: Insufficient documentation

## 2024-02-05 DIAGNOSIS — F1090 Alcohol use, unspecified, uncomplicated: Secondary | ICD-10-CM

## 2024-02-05 DIAGNOSIS — Z5900 Homelessness unspecified: Secondary | ICD-10-CM | POA: Insufficient documentation

## 2024-02-05 DIAGNOSIS — Z560 Unemployment, unspecified: Secondary | ICD-10-CM | POA: Insufficient documentation

## 2024-02-05 DIAGNOSIS — F1490 Cocaine use, unspecified, uncomplicated: Secondary | ICD-10-CM | POA: Insufficient documentation

## 2024-02-05 DIAGNOSIS — R61 Generalized hyperhidrosis: Secondary | ICD-10-CM | POA: Insufficient documentation

## 2024-02-05 DIAGNOSIS — Z79899 Other long term (current) drug therapy: Secondary | ICD-10-CM | POA: Insufficient documentation

## 2024-02-05 DIAGNOSIS — F1910 Other psychoactive substance abuse, uncomplicated: Secondary | ICD-10-CM | POA: Diagnosis present

## 2024-02-05 DIAGNOSIS — F411 Generalized anxiety disorder: Secondary | ICD-10-CM | POA: Insufficient documentation

## 2024-02-05 DIAGNOSIS — F1110 Opioid abuse, uncomplicated: Secondary | ICD-10-CM | POA: Insufficient documentation

## 2024-02-05 DIAGNOSIS — F1190 Opioid use, unspecified, uncomplicated: Secondary | ICD-10-CM | POA: Diagnosis not present

## 2024-02-05 DIAGNOSIS — F331 Major depressive disorder, recurrent, moderate: Secondary | ICD-10-CM | POA: Insufficient documentation

## 2024-02-05 DIAGNOSIS — F1010 Alcohol abuse, uncomplicated: Secondary | ICD-10-CM | POA: Insufficient documentation

## 2024-02-05 LAB — POCT URINE DRUG SCREEN - MANUAL ENTRY (I-SCREEN)
POC Amphetamine UR: NOT DETECTED
POC Buprenorphine (BUP): NOT DETECTED
POC Cocaine UR: POSITIVE — AB
POC Marijuana UR: POSITIVE — AB
POC Methadone UR: NOT DETECTED
POC Methamphetamine UR: POSITIVE — AB
POC Morphine: NOT DETECTED
POC Oxazepam (BZO): NOT DETECTED
POC Oxycodone UR: POSITIVE — AB
POC Secobarbital (BAR): NOT DETECTED

## 2024-02-05 MED ORDER — ACETAMINOPHEN 325 MG PO TABS: 650.0000 mg | ORAL_TABLET | Freq: Four times a day (QID) | ORAL | Status: DC | PRN

## 2024-02-05 MED ORDER — CHLORDIAZEPOXIDE HCL 25 MG PO CAPS: 25.0000 mg | ORAL_CAPSULE | ORAL | Status: DC

## 2024-02-05 MED ORDER — CHLORDIAZEPOXIDE HCL 25 MG PO CAPS: 25.0000 mg | ORAL_CAPSULE | Freq: Four times a day (QID) | ORAL | Status: AC | PRN

## 2024-02-05 MED ORDER — CHLORDIAZEPOXIDE HCL 25 MG PO CAPS: 25.0000 mg | ORAL_CAPSULE | Freq: Three times a day (TID) | ORAL | Status: DC

## 2024-02-05 MED ORDER — ADULT MULTIVITAMIN W/MINERALS CH
1.0000 | ORAL_TABLET | Freq: Every day | ORAL | Status: DC
  Administered 2024-02-06 – 2024-02-12 (×9): 1 via ORAL
  Filled 2024-02-05 (×7): qty 1

## 2024-02-05 MED ORDER — ALUM & MAG HYDROXIDE-SIMETH 200-200-20 MG/5ML PO SUSP: 30.0000 mL | ORAL | Status: DC | PRN

## 2024-02-05 MED ORDER — DIPHENHYDRAMINE HCL 50 MG/ML IJ SOLN: 50.0000 mg | Freq: Three times a day (TID) | INTRAMUSCULAR | Status: DC | PRN

## 2024-02-05 MED ORDER — HALOPERIDOL 5 MG PO TABS: 5.0000 mg | ORAL_TABLET | Freq: Three times a day (TID) | ORAL | Status: DC | PRN

## 2024-02-05 MED ORDER — LORAZEPAM 2 MG/ML IJ SOLN: 2.0000 mg | Freq: Three times a day (TID) | INTRAMUSCULAR | Status: DC | PRN

## 2024-02-05 MED ORDER — ONDANSETRON 4 MG PO TBDP: 4.0000 mg | ORAL_TABLET | Freq: Four times a day (QID) | ORAL | Status: DC | PRN

## 2024-02-05 MED ORDER — DIPHENHYDRAMINE HCL 50 MG PO CAPS: 50.0000 mg | ORAL_CAPSULE | Freq: Three times a day (TID) | ORAL | Status: DC | PRN

## 2024-02-05 MED ORDER — THIAMINE HCL 100 MG/ML IJ SOLN
100.0000 mg | Freq: Once | INTRAMUSCULAR | Status: AC
  Administered 2024-02-05 (×2): 100 mg via INTRAMUSCULAR
  Filled 2024-02-05: qty 2

## 2024-02-05 MED ORDER — HALOPERIDOL LACTATE 5 MG/ML IJ SOLN: 5.0000 mg | Freq: Three times a day (TID) | INTRAMUSCULAR | Status: DC | PRN

## 2024-02-05 MED ORDER — CHLORDIAZEPOXIDE HCL 25 MG PO CAPS
25.0000 mg | ORAL_CAPSULE | Freq: Four times a day (QID) | ORAL | Status: DC
  Administered 2024-02-05 (×2): 25 mg via ORAL
  Filled 2024-02-05: qty 1

## 2024-02-05 MED ORDER — HYDROXYZINE HCL 25 MG PO TABS: 25.0000 mg | ORAL_TABLET | Freq: Four times a day (QID) | ORAL | Status: DC | PRN

## 2024-02-05 MED ORDER — HALOPERIDOL LACTATE 5 MG/ML IJ SOLN: 10.0000 mg | Freq: Three times a day (TID) | INTRAMUSCULAR | Status: DC | PRN

## 2024-02-05 MED ORDER — MAGNESIUM HYDROXIDE 400 MG/5ML PO SUSP: 30.0000 mL | Freq: Every day | ORAL | Status: DC | PRN

## 2024-02-05 MED ORDER — SERTRALINE HCL 25 MG PO TABS
25.0000 mg | ORAL_TABLET | Freq: Every day | ORAL | Status: DC
  Administered 2024-02-06 – 2024-02-07 (×4): 25 mg via ORAL
  Filled 2024-02-05 (×2): qty 1

## 2024-02-05 MED ORDER — LOPERAMIDE HCL 2 MG PO CAPS: 2.0000 mg | ORAL_CAPSULE | ORAL | Status: DC | PRN

## 2024-02-05 MED ORDER — CHLORDIAZEPOXIDE HCL 25 MG PO CAPS: 25.0000 mg | ORAL_CAPSULE | Freq: Every day | ORAL | Status: DC

## 2024-02-05 MED ORDER — TRAZODONE HCL 50 MG PO TABS: 50.0000 mg | ORAL_TABLET | Freq: Every evening | ORAL | Status: DC | PRN

## 2024-02-05 MED ORDER — MELATONIN 3 MG PO TABS
3.0000 mg | ORAL_TABLET | Freq: Every evening | ORAL | Status: DC | PRN
  Administered 2024-02-05 (×2): 3 mg via ORAL
  Filled 2024-02-05: qty 1
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
## 2024-02-05 NOTE — ED Provider Notes (Addendum)
 Facility Based Crisis Admission H&P  Date: 02/06/24 Patient Name: Juan Hood MRN: [id number] Chief Complaint:  I came to get off the drugs   Diagnoses:  Final diagnoses:  Polysubstance abuse (HCC)  Homelessness  Alcohol use disorder  Crack cocaine use  Opioid use disorder    HPI:  Juan Hood is a 48 year old male with psychiatric history of polysubstance abuse-cocaine, alcohol, tobacco, GAD, Homelessness, and 1 Accidental Overdose (6/21),who presented voluntarily as a walk in to [facility] seeking detox/substance use treatment from crack cocaine and alcohol.   Patient was seen face to face by this provider and chart reviewed.  Per chart review, he started using crack cocaine and smoking marijuana around age 25 and drinking at age 71. Patient denies that he has had any sustained periods of sobriety in his life. Patient reports that he is unemployed and homeless (6-7 years  I'm staying wherever I can stay).  Patient has experienced alcohol withdrawal symptoms of nausea, tremors and has had blackouts from drinking excessively in the past. Patient denies experiencing any seizures.   Tonight, patient is slurring his words, inattentive and appears nonchalant.  He has difficulty finding his words.   He states I came for.............., what is that they call when you want to get off substance?  Patient is asked if he is seeking detox and he replies yes.  Patient reports alcohol use daily for the past 4 years and crack cocaine use daily for the past 4 years.  Last use of both substances 2 days ago. Patient reports he drinks about a case of beer a day if I could, I do whenever I can get it. Patient denies current withdrawal symptoms.  Patient denies other illicit substance use.  He denies access to gun.  Patient reports he is not established with outpatient psychiatric providers and is not taking any medications.  He report briefly going to [facility] in the past.   Patient completed  the PHQ-9 questionnaire and obtained a total score of 22, indicating severe depression.   On evaluation, patient is alert, oriented x 3, and cooperative. Speech is slurred. Pt appears fairly groomed. Eye contact is poor. Mood is euthymic affect is congruent with mood. Thought process is coherent and goal directed and thought content is WDL. Pt denies SI/HI/AVH. There is no objective indication that the patient is responding to internal stimuli. No delusions elicited during this assessment.    Discussed recommendation for admission to the [facility] for substance abuse treatment.  Discussed CIWA protocol. Discussed FBC milieu and expectations. Patient is provided with opportunity for questions.  He verbalized understanding and is in agreement.  PHQ 2-9:  Flowsheet Row ED from 02/05/2024 in [facility] ED from 07/09/2022 in [facility]  Thoughts that you would be better off dead, or of hurting yourself in some way Not at all Not at all  PHQ-9 Total Score 22 8    Flowsheet Row ED from 02/05/2024 in [facility] Most recent reading at 02/05/2024 11:36 PM ED from 02/05/2024 in [facility] Most recent reading at 02/05/2024 10:17 PM ED from 07/09/2022 in [facility] Most recent reading at 07/13/2022  9:52 AM  C-SSRS RISK CATEGORY No Risk No Risk No Risk      Total Time spent with patient: 30 minutes  Musculoskeletal  Strength & Muscle Tone: within normal limits Gait & Station: normal Patient leans: N/A  Psychiatric Specialty  Exam  Presentation General Appearance:  Fairly Groomed  Eye Contact: Poor  Speech: Slurred  Speech Volume: Normal  Handedness: Right   Mood and Affect  Mood: Euthymic  Affect: Congruent   Thought Process  Thought Processes: Coherent  Descriptions of Associations:Intact  Orientation:Full (Time, Place and  Person)  Thought Content:WDL  Diagnosis of Schizophrenia or Schizoaffective disorder in past: No data recorded  Hallucinations:Hallucinations: None  Ideas of Reference:None  Suicidal Thoughts:Suicidal Thoughts: No  Homicidal Thoughts:Homicidal Thoughts: No   Sensorium  Memory: Immediate Fair  Judgment: Poor  Insight: None   Executive Functions  Concentration: Poor  Attention Span: Poor  Recall: [employer] of Knowledge: Fair  Language: Fair   Psychomotor Activity  Psychomotor Activity: Psychomotor Activity: Normal   Assets  Assets: Manufacturing systems engineer; Desire for Improvement   Sleep  Sleep: Sleep: Fair   Nutritional Assessment (For OBS and FBC admissions only) Has the patient had a weight loss or gain of 10 pounds or more in the last 3 months?: No Has the patient had a decrease in food intake/or appetite?: No Does the patient have dental problems?: No Does the patient have eating habits or behaviors that may be indicators of an eating disorder including binging or inducing vomiting?: No Has the patient recently lost weight without trying?: 0 Has the patient been eating poorly because of a decreased appetite?: 0 Malnutrition Screening Tool Score: 0    Physical Exam Constitutional:      General: He is not in acute distress.    Appearance: He is not diaphoretic.  HENT:     Nose: No congestion.  Pulmonary:     Effort: No respiratory distress.  Chest:     Chest wall: No tenderness.  Neurological:     Mental Status: He is alert and oriented to person, place, and time.  Psychiatric:        Attention and Perception: He is inattentive.        Mood and Affect: Mood and affect normal.        Speech: Speech is slurred.        Behavior: Behavior is cooperative.        Thought Content: Thought content normal.    Review of Systems  Constitutional:  Negative for chills, diaphoresis and fever.  HENT:  Negative for congestion.   Eyes:  Negative  for discharge.  Respiratory:  Negative for cough, shortness of breath and wheezing.   Cardiovascular:  Negative for chest pain and palpitations.  Gastrointestinal:  Negative for diarrhea, nausea and vomiting.  Neurological:  Negative for dizziness, seizures, loss of consciousness, weakness and headaches.  Psychiatric/Behavioral:  Positive for substance abuse.     Past Psychiatric History: See H &P   Is the patient at risk to self? No  Has the patient been a risk to self in the past 6 months? No .    Has the patient been a risk to self within the distant past? No   Is the patient a risk to others? No   Has the patient been a risk to others in the past 6 months? No   Has the patient been a risk to others within the distant past? No   Past Medical History: See Chart Family History: N/A Social History: N/A  Last Labs:  Admission on 02/05/2024  Component Date Value Ref Range Status   WBC 02/05/2024 5.3  4.0 - 10.5 K/uL Final   RBC 02/05/2024 4.97  4.22 - 5.81 MIL/uL Final  Hemoglobin 02/05/2024 16.8  13.0 - 17.0 g/dL Final   HCT 91/88/7974 48.5  39.0 - 52.0 % Final   MCV 02/05/2024 97.6  80.0 - 100.0 fL Final   MCH 02/05/2024 33.8  26.0 - 34.0 pg Final   MCHC 02/05/2024 34.6  30.0 - 36.0 g/dL Final   RDW 91/88/7974 12.2  11.5 - 15.5 % Final   Platelets 02/05/2024 263  150 - 400 K/uL Final   nRBC 02/05/2024 0.0  0.0 - 0.2 % Final   Neutrophils Relative % 02/05/2024 38  % Final   Neutro Abs 02/05/2024 2.0  1.7 - 7.7 K/uL Final   Lymphocytes Relative 02/05/2024 48  % Final   Lymphs Abs 02/05/2024 2.5  0.7 - 4.0 K/uL Final   Monocytes Relative 02/05/2024 9  % Final   Monocytes Absolute 02/05/2024 0.5  0.1 - 1.0 K/uL Final   Eosinophils Relative 02/05/2024 4  % Final   Eosinophils Absolute 02/05/2024 0.2  0.0 - 0.5 K/uL Final   Basophils Relative 02/05/2024 1  % Final   Basophils Absolute 02/05/2024 0.1  0.0 - 0.1 K/uL Final   Immature Granulocytes 02/05/2024 0  % Final   Abs  Immature Granulocytes 02/05/2024 0.02  0.00 - 0.07 K/uL Final   Performed at [facility], [address] [place], [place] [address]   Sodium 02/05/2024 138  135 - 145 mmol/L Final   Potassium 02/05/2024 5.0  3.5 - 5.1 mmol/L Final   Chloride 02/05/2024 103  98 - 111 mmol/L Final   CO2 02/05/2024 26  22 - 32 mmol/L Final   Glucose, Bld 02/05/2024 76  70 - 99 mg/dL Final   Glucose reference range applies only to samples taken after fasting for at least 8 hours.   BUN 02/05/2024 8  6 - 20 mg/dL Final   Creatinine, Ser 02/05/2024 0.98  0.61 - 1.24 mg/dL Final   Calcium 91/88/7974 9.1  8.9 - 10.3 mg/dL Final   Total Protein 91/88/7974 6.9  6.5 - 8.1 g/dL Final   Albumin 91/88/7974 3.4 (L)  3.5 - 5.0 g/dL Final   AST 91/88/7974 34  15 - 41 U/L Final   ALT 02/05/2024 41  0 - 44 U/L Final   Alkaline Phosphatase 02/05/2024 91  38 - 126 U/L Final   Total Bilirubin 02/05/2024 1.1  0.0 - 1.2 mg/dL Final   GFR, Estimated 02/05/2024 >60  >60 mL/min Final   Comment: (NOTE) Calculated using the CKD-EPI Creatinine Equation (2021)    Anion gap 02/05/2024 9  5 - 15 Final   Performed at [facility], [address] [place], [place] [address]   Alcohol, Ethyl (B) 02/05/2024 <15  <15 mg/dL Final   Comment: (NOTE) For medical purposes only. Performed at [facility], [address], [place], [place] [address]    Cholesterol 02/05/2024 206 (H)  0 - 200 mg/dL Final   Triglycerides 91/88/7974 133  <150 mg/dL Final   HDL 91/88/7974 90  >40 mg/dL Final   Total CHOL/HDL Ratio 02/05/2024 2.3  RATIO Final   VLDL 02/05/2024 27  0 - 40 mg/dL Final   LDL Cholesterol 02/05/2024 89  0 - 99 mg/dL Final   Comment:        Total Cholesterol/HDL:CHD Risk Coronary Heart Disease Risk Table                     Men   Women  1/2 Average Risk   3.4  3.3  Average Risk       5.0   4.4  2 X Average Risk   9.6   7.1  3 X Average Risk  23.4   11.0        Use the calculated Patient Ratio above and the  CHD Risk Table to determine the patient's CHD Risk.        ATP III CLASSIFICATION (LDL):  <100     mg/dL   Optimal  899-870  mg/dL   Near or Above                    Optimal  130-159  mg/dL   Borderline  839-810  mg/dL   High  >809     mg/dL   Very High Performed at [facility], [address], [place], [place] [address]    TSH 02/05/2024 0.692  0.350 - 4.500 uIU/mL Final   Comment: Performed by a 3rd Generation assay with a functional sensitivity of <=0.01 uIU/mL. Performed at [facility], [address] [place], [place] [address]    POC Amphetamine UR 02/05/2024 None Detected  NONE DETECTED (Cut Off Level 1000 ng/mL) Final   POC Secobarbital (BAR) 02/05/2024 None Detected  NONE DETECTED (Cut Off Level 300 ng/mL) Final   POC Buprenorphine (BUP) 02/05/2024 None Detected  NONE DETECTED (Cut Off Level 10 ng/mL) Final   POC Oxazepam (BZO) 02/05/2024 None Detected  NONE DETECTED (Cut Off Level 300 ng/mL) Final   POC Cocaine UR 02/05/2024 Positive (A)  NONE DETECTED (Cut Off Level 300 ng/mL) Final   POC Methamphetamine UR 02/05/2024 Positive (A)  NONE DETECTED (Cut Off Level 1000 ng/mL) Final   POC Morphine 02/05/2024 None Detected  NONE DETECTED (Cut Off Level 300 ng/mL) Final   POC Methadone UR 02/05/2024 None Detected  NONE DETECTED (Cut Off Level 300 ng/mL) Final   POC Oxycodone UR 02/05/2024 Positive (A)  NONE DETECTED (Cut Off Level 100 ng/mL) Final   POC Marijuana UR 02/05/2024 Positive (A)  NONE DETECTED (Cut Off Level 50 ng/mL) Final    Allergies: Patient has no known allergies.  Medications:  Facility Ordered Medications  Medication   alum & mag hydroxide-simeth (MAALOX/MYLANTA) 200-200-20 MG/5ML suspension 30 mL   magnesium  hydroxide (MILK OF MAGNESIA) suspension 30 mL   [COMPLETED] thiamine  (VITAMIN B1) injection 100 mg   multivitamin with minerals tablet 1 tablet   chlordiazePOXIDE  (LIBRIUM ) capsule 25 mg   haloperidol  (HALDOL ) tablet 5 mg   And    diphenhydrAMINE  (BENADRYL ) capsule 50 mg   haloperidol  lactate (HALDOL ) injection 5 mg   And   diphenhydrAMINE  (BENADRYL ) injection 50 mg   And   LORazepam  (ATIVAN ) injection 2 mg   haloperidol  lactate (HALDOL ) injection 10 mg   And   diphenhydrAMINE  (BENADRYL ) injection 50 mg   And   LORazepam  (ATIVAN ) injection 2 mg   sertraline  (ZOLOFT ) tablet 25 mg   melatonin tablet 3 mg   dicyclomine  (BENTYL ) tablet 20 mg   hydrOXYzine  (ATARAX ) tablet 25 mg   loperamide  (IMODIUM ) capsule 2-4 mg   methocarbamol  (ROBAXIN ) tablet 500 mg   naproxen  (NAPROSYN ) tablet 500 mg   ondansetron  (ZOFRAN -ODT) disintegrating tablet 4 mg   PTA Medications  Medication Sig   sertraline  (ZOLOFT ) 25 MG tablet Take 1 tablet (25 mg total) by mouth daily.   traZODone  (DESYREL ) 50 MG tablet Take 1 tablet (50 mg total) by mouth at bedtime as needed for sleep.   propranolol (INDERAL)  10 MG tablet Take 10 mg by mouth 3 (three) times daily as needed.    Long Term Goals: Improvement in symptoms so as ready for discharge  Short Term Goals: Patient will verbalize feelings in meetings with treatment team members., Patient will attend at least of 50% of the groups daily., Pt will complete the PHQ9 on admission, day 3 and discharge., Patient will participate in completing the [place] Suicide Severity Rating Scale, Patient will score a low risk of violence for 24 hours prior to discharge, and Patient will take medications as prescribed daily.  Medical Decision Making  Recommend admission to the [facility] for substance abuse treatment.  Lab Orders         CBC with Differential/Platelet         Comprehensive metabolic panel         Hemoglobin A1c         Ethanol         Lipid panel         TSH         POCT Urine Drug Screen - (I-Screen)     EKG  Recommend CIWA Protocol. AUD Recommend Cows Protocol-OUD  Home medications started -Sertraline  25 mg Po daily for MDD  Prn meds -Prn agitation Protocol -Maalox. MOM,  Melatonin   Recommendations  Based on my evaluation the patient does not appear to have an emergency medical condition.  Recommend admission to the [facility] for substance abuse treatment.  Thurman LULLA Ivans, NP 02/06/24  5:30 AM

## 2024-02-05 NOTE — ED Notes (Signed)
 Pt admitted to [facility] for detox. On arrival, pt A/Ox4. MAE. Answered all questions during admission assessment and cooperated well. Oriented to the unit and unit rules. Skin assessments WNL. Denies SI/HI/AVH. Offered patient something to eat and made him comfortable in bed. Will monitor for safety.

## 2024-02-06 DIAGNOSIS — F1190 Opioid use, unspecified, uncomplicated: Secondary | ICD-10-CM | POA: Diagnosis not present

## 2024-02-06 DIAGNOSIS — F1490 Cocaine use, unspecified, uncomplicated: Secondary | ICD-10-CM | POA: Diagnosis not present

## 2024-02-06 DIAGNOSIS — F1090 Alcohol use, unspecified, uncomplicated: Secondary | ICD-10-CM | POA: Diagnosis not present

## 2024-02-06 DIAGNOSIS — F1910 Other psychoactive substance abuse, uncomplicated: Secondary | ICD-10-CM | POA: Diagnosis not present

## 2024-02-06 LAB — CBC WITH DIFFERENTIAL/PLATELET
Abs Immature Granulocytes: 0.02 K/uL (ref 0.00–0.07)
Basophils Absolute: 0.1 K/uL (ref 0.0–0.1)
Basophils Relative: 1 %
Eosinophils Absolute: 0.2 K/uL (ref 0.0–0.5)
Eosinophils Relative: 4 %
HCT: 48.5 % (ref 39.0–52.0)
Hemoglobin: 16.8 g/dL (ref 13.0–17.0)
Immature Granulocytes: 0 %
Lymphocytes Relative: 48 %
Lymphs Abs: 2.5 K/uL (ref 0.7–4.0)
MCH: 33.8 pg (ref 26.0–34.0)
MCHC: 34.6 g/dL (ref 30.0–36.0)
MCV: 97.6 fL (ref 80.0–100.0)
Monocytes Absolute: 0.5 K/uL (ref 0.1–1.0)
Monocytes Relative: 9 %
Neutro Abs: 2 K/uL (ref 1.7–7.7)
Neutrophils Relative %: 38 %
Platelets: 263 K/uL (ref 150–400)
RBC: 4.97 MIL/uL (ref 4.22–5.81)
RDW: 12.2 % (ref 11.5–15.5)
WBC: 5.3 K/uL (ref 4.0–10.5)
nRBC: 0 % (ref 0.0–0.2)

## 2024-02-06 LAB — COMPREHENSIVE METABOLIC PANEL WITH GFR
ALT: 41 U/L (ref 0–44)
AST: 34 U/L (ref 15–41)
Albumin: 3.4 g/dL — ABNORMAL LOW (ref 3.5–5.0)
Alkaline Phosphatase: 91 U/L (ref 38–126)
Anion gap: 9 (ref 5–15)
BUN: 8 mg/dL (ref 6–20)
CO2: 26 mmol/L (ref 22–32)
Calcium: 9.1 mg/dL (ref 8.9–10.3)
Chloride: 103 mmol/L (ref 98–111)
Creatinine, Ser: 0.98 mg/dL (ref 0.61–1.24)
GFR, Estimated: >60 mL/min (ref >60)
Glucose, Bld: 76 mg/dL (ref 70–99)
Potassium: 5 mmol/L (ref 3.5–5.1)
Sodium: 138 mmol/L (ref 135–145)
Total Bilirubin: 1.1 mg/dL (ref 0.0–1.2)
Total Protein: 6.9 g/dL (ref 6.5–8.1)

## 2024-02-06 LAB — LIPID PANEL
Cholesterol: 206 mg/dL — ABNORMAL HIGH (ref 0–200)
HDL: 90 mg/dL (ref >40)
LDL Cholesterol: 89 mg/dL (ref 0–99)
Total CHOL/HDL Ratio: 2.3 ratio
Triglycerides: 133 mg/dL (ref <150)
VLDL: 27 mg/dL (ref 0–40)

## 2024-02-06 LAB — TSH: TSH: 0.692 u[IU]/mL (ref 0.350–4.500)

## 2024-02-06 LAB — ETHANOL: Alcohol, Ethyl (B): <15 mg/dL (ref <15)

## 2024-02-06 MED ORDER — DICYCLOMINE HCL 20 MG PO TABS: 20.0000 mg | ORAL_TABLET | Freq: Four times a day (QID) | ORAL | Status: AC | PRN

## 2024-02-06 MED ORDER — ONDANSETRON 4 MG PO TBDP: 4.0000 mg | ORAL_TABLET | Freq: Four times a day (QID) | ORAL | Status: AC | PRN

## 2024-02-06 MED ORDER — HYDROXYZINE HCL 25 MG PO TABS
25.0000 mg | ORAL_TABLET | Freq: Four times a day (QID) | ORAL | Status: AC | PRN
  Administered 2024-02-09: 25 mg via ORAL
  Filled 2024-02-06: qty 1

## 2024-02-06 MED ORDER — METHOCARBAMOL 500 MG PO TABS: 500.0000 mg | ORAL_TABLET | Freq: Three times a day (TID) | ORAL | Status: AC | PRN

## 2024-02-06 MED ORDER — LOPERAMIDE HCL 2 MG PO CAPS: 2.0000 mg | ORAL_CAPSULE | ORAL | Status: AC | PRN

## 2024-02-06 MED ORDER — NAPROXEN 500 MG PO TABS: 500.0000 mg | ORAL_TABLET | Freq: Two times a day (BID) | ORAL | Status: AC | PRN

## 2024-02-06 NOTE — ED Notes (Signed)
 Patient is in the bedroom sleeping. NAD

## 2024-02-06 NOTE — ED Notes (Signed)
 Pt resting in room. Pt ate lunch. Pt provided with contact of family friend to return phone call. pt generally isolative.

## 2024-02-06 NOTE — Group Note (Signed)
 Group Topic: Recovery Basics  Group Date: 02/06/2024 Start Time: 9184 End Time: 0930 Facilitators: Lonzell Dwayne RAMAN, NT  Department: [facility]  Number of Participants: 3  Group Focus: chemical dependency issues Treatment Modality:  Psychoeducation Interventions utilized were confrontation Purpose: explore maladaptive thinking  Name: Juan Hood Date of Birth: 08-Dec-1975  MR: [id number]    Level of Participation: Patient did not attend group Quality of Participation: N/A Interactions with others: N/A Mood/Affect: N/A Triggers (if applicable): N/A Cognition: N/A Progress: N/A Response: N/A  Plan: N/A  Patients Problems:  Patient Active Problem List   Diagnosis Date Noted   Polysubstance abuse (HCC) 02/05/2024   GAD (generalized anxiety disorder) 09/26/2022   Cocaine use disorder (HCC) 07/09/2022   Cocaine abuse (HCC) 12/08/2019   Alcohol abuse 12/08/2019   Tobacco abuse 12/08/2019   Acute hypoxic Respiratory failure (HCC) 12/08/2019

## 2024-02-06 NOTE — ED Provider Notes (Signed)
 Behavioral Health Progress Note  Date and Time: 02/06/2024 6:54 PM Name: Juan Hood MRN:  [id number]  Subjective:  Juan Hood is doing alright today. He's spent much of the day in bed. Appetite is okay. Sleep is fair but notable sweats. Otherwise he denies withdrawal symptoms. He was started on sertraline  and he denies side effects. Patient has previous experience with [facility] and would like to return to their program.   Diagnosis:  Final diagnoses:  Polysubstance abuse (HCC)  Homelessness  Alcohol use disorder  Crack cocaine use  Opioid use disorder   HPI:  Juan Hood is a 48 year old male with psychiatric history of polysubstance abuse-cocaine, alcohol, tobacco, GAD, Homelessness, and 1 Accidental Overdose (6/21),who presented voluntarily as a walk in to [facility] seeking detox/substance use treatment from crack cocaine and alcohol.    Patient was seen face to face by this provider and chart reviewed.  Per chart review, he started using crack cocaine and smoking marijuana around age 71 and drinking at age 77. Patient denies that he has had any sustained periods of sobriety in his life. Patient reports that he is unemployed and homeless (6-7 years  I'm staying wherever I can stay).  Patient has experienced alcohol withdrawal symptoms of nausea, tremors and has had blackouts from drinking excessively in the past. Patient denies experiencing any seizures.    Tonight, patient is slurring his words, inattentive and appears nonchalant.  He has difficulty finding his words.   He states I came for.............., what is that they call when you want to get off substance?  Patient is asked if he is seeking detox and he replies yes.  Patient reports alcohol use daily for the past 4 years and crack cocaine use daily for the past 4 years.  Last use of both substances 2 days ago. Patient reports he drinks about a case of beer a day if I could, I do whenever I can get it.Patient denies current  withdrawal symptoms.   Patient denies other illicit substance use.  He denies access to gun.  Patient reports he is not established with outpatient psychiatric providers and is not taking any medications.   He report briefly going to [facility] in the past.    Patient completed the PHQ-9 questionnaire and obtained a total score of 22, indicating severe depression.   Total Time spent with patient: I personally spent 40 minutes on the unit in direct patient care. The direct patient care time included face-to-face time with the patient, reviewing the patient's chart, communicating with other professionals, and coordinating care. Greater than 50% of this time was spent in counseling or coordinating care with the patient regarding goals of hospitalization, psycho-education, and discharge planning needs.  Past Psychiatric History: polysubstance use disorder (EtOH, cocaine), GAD Past Medical History: acute hypoxic respiratory failure (accidental overdose) Social History: homeless  Sleep: Fair  Appetite:  Fair  Current Medications:  Current Facility-Administered Medications  Medication Dose Route Frequency Provider Last Rate Last Admin   alum & mag hydroxide-simeth (MAALOX/MYLANTA) 200-200-20 MG/5ML suspension 30 mL  30 mL Oral Q4H PRN Onuoha, Chinwendu V, NP       chlordiazePOXIDE  (LIBRIUM ) capsule 25 mg  25 mg Oral Q6H PRN Onuoha, Chinwendu V, NP       dicyclomine  (BENTYL ) tablet 20 mg  20 mg Oral Q6H PRN Onuoha, Chinwendu V, NP       haloperidol  (HALDOL ) tablet 5 mg  5 mg Oral TID PRN Alan, Chinwendu V, NP  And   diphenhydrAMINE  (BENADRYL ) capsule 50 mg  50 mg Oral TID PRN Onuoha, Chinwendu V, NP       haloperidol  lactate (HALDOL ) injection 5 mg  5 mg Intramuscular TID PRN Onuoha, Chinwendu V, NP       And   diphenhydrAMINE  (BENADRYL ) injection 50 mg  50 mg Intramuscular TID PRN Onuoha, Chinwendu V, NP       And   LORazepam  (ATIVAN ) injection 2 mg  2 mg Intramuscular TID PRN Onuoha,  Chinwendu V, NP       haloperidol  lactate (HALDOL ) injection 10 mg  10 mg Intramuscular TID PRN Onuoha, Chinwendu V, NP       And   diphenhydrAMINE  (BENADRYL ) injection 50 mg  50 mg Intramuscular TID PRN Onuoha, Chinwendu V, NP       And   LORazepam  (ATIVAN ) injection 2 mg  2 mg Intramuscular TID PRN Onuoha, Chinwendu V, NP       hydrOXYzine  (ATARAX ) tablet 25 mg  25 mg Oral Q6H PRN Onuoha, Chinwendu V, NP       loperamide  (IMODIUM ) capsule 2-4 mg  2-4 mg Oral PRN Onuoha, Chinwendu V, NP       magnesium  hydroxide (MILK OF MAGNESIA) suspension 30 mL  30 mL Oral Daily PRN Onuoha, Chinwendu V, NP       melatonin tablet 3 mg  3 mg Oral QHS PRN Onuoha, Chinwendu V, NP   3 mg at 02/05/24 2324   methocarbamol  (ROBAXIN ) tablet 500 mg  500 mg Oral Q8H PRN Onuoha, Chinwendu V, NP       multivitamin with minerals tablet 1 tablet  1 tablet Oral Daily Onuoha, Chinwendu V, NP   1 tablet at 02/06/24 0941   naproxen  (NAPROSYN ) tablet 500 mg  500 mg Oral BID PRN Onuoha, Chinwendu V, NP       ondansetron  (ZOFRAN -ODT) disintegrating tablet 4 mg  4 mg Oral Q6H PRN Onuoha, Chinwendu V, NP       sertraline  (ZOLOFT ) tablet 25 mg  25 mg Oral Daily Onuoha, Chinwendu V, NP   25 mg at 02/06/24 0941   No current outpatient medications on file.    Labs  Lab Results:  Admission on 02/05/2024  Component Date Value Ref Range Status   WBC 02/05/2024 5.3  4.0 - 10.5 K/uL Final   RBC 02/05/2024 4.97  4.22 - 5.81 MIL/uL Final   Hemoglobin 02/05/2024 16.8  13.0 - 17.0 g/dL Final   HCT 91/88/7974 48.5  39.0 - 52.0 % Final   MCV 02/05/2024 97.6  80.0 - 100.0 fL Final   MCH 02/05/2024 33.8  26.0 - 34.0 pg Final   MCHC 02/05/2024 34.6  30.0 - 36.0 g/dL Final   RDW 91/88/7974 12.2  11.5 - 15.5 % Final   Platelets 02/05/2024 263  150 - 400 K/uL Final   nRBC 02/05/2024 0.0  0.0 - 0.2 % Final   Neutrophils Relative % 02/05/2024 38  % Final   Neutro Abs 02/05/2024 2.0  1.7 - 7.7 K/uL Final   Lymphocytes Relative 02/05/2024 48   % Final   Lymphs Abs 02/05/2024 2.5  0.7 - 4.0 K/uL Final   Monocytes Relative 02/05/2024 9  % Final   Monocytes Absolute 02/05/2024 0.5  0.1 - 1.0 K/uL Final   Eosinophils Relative 02/05/2024 4  % Final   Eosinophils Absolute 02/05/2024 0.2  0.0 - 0.5 K/uL Final   Basophils Relative 02/05/2024 1  % Final   Basophils Absolute 02/05/2024 0.1  0.0 - 0.1 K/uL Final   Immature Granulocytes 02/05/2024 0  % Final   Abs Immature Granulocytes 02/05/2024 0.02  0.00 - 0.07 K/uL Final   Performed at [facility], [address], [place], [place] [address]   Sodium 02/05/2024 138  135 - 145 mmol/L Final   Potassium 02/05/2024 5.0  3.5 - 5.1 mmol/L Final   Chloride 02/05/2024 103  98 - 111 mmol/L Final   CO2 02/05/2024 26  22 - 32 mmol/L Final   Glucose, Bld 02/05/2024 76  70 - 99 mg/dL Final   Glucose reference range applies only to samples taken after fasting for at least 8 hours.   BUN 02/05/2024 8  6 - 20 mg/dL Final   Creatinine, Ser 02/05/2024 0.98  0.61 - 1.24 mg/dL Final   Calcium 91/88/7974 9.1  8.9 - 10.3 mg/dL Final   Total Protein 91/88/7974 6.9  6.5 - 8.1 g/dL Final   Albumin 91/88/7974 3.4 (L)  3.5 - 5.0 g/dL Final   AST 91/88/7974 34  15 - 41 U/L Final   ALT 02/05/2024 41  0 - 44 U/L Final   Alkaline Phosphatase 02/05/2024 91  38 - 126 U/L Final   Total Bilirubin 02/05/2024 1.1  0.0 - 1.2 mg/dL Final   GFR, Estimated 02/05/2024 >60  >60 mL/min Final   Comment: (NOTE) Calculated using the CKD-EPI Creatinine Equation (2021)    Anion gap 02/05/2024 9  5 - 15 Final   Performed at [facility], [address] [place], [place] [address]   Alcohol, Ethyl (B) 02/05/2024 <15  <15 mg/dL Final   Comment: (NOTE) For medical purposes only. Performed at [facility], [address] [place], [place] [address]    Cholesterol 02/05/2024 206 (H)  0 - 200 mg/dL Final   Triglycerides 91/88/7974 133  <150 mg/dL Final   HDL 91/88/7974 90  >40 mg/dL Final   Total CHOL/HDL  Ratio 02/05/2024 2.3  RATIO Final   VLDL 02/05/2024 27  0 - 40 mg/dL Final   LDL Cholesterol 02/05/2024 89  0 - 99 mg/dL Final   Comment:        Total Cholesterol/HDL:CHD Risk Coronary Heart Disease Risk Table                     Men   Women  1/2 Average Risk   3.4   3.3  Average Risk       5.0   4.4  2 X Average Risk   9.6   7.1  3 X Average Risk  23.4   11.0        Use the calculated Patient Ratio above and the CHD Risk Table to determine the patient's CHD Risk.        ATP III CLASSIFICATION (LDL):  <100     mg/dL   Optimal  899-870  mg/dL   Near or Above                    Optimal  130-159  mg/dL   Borderline  839-810  mg/dL   High  >809     mg/dL   Very High Performed at [facility], [address], [place], [place] [address]    TSH 02/05/2024 0.692  0.350 - 4.500 uIU/mL Final   Comment: Performed by a 3rd Generation assay with a functional sensitivity of <=0.01 uIU/mL. Performed at [facility], [address],  [place], [place] [address]    POC Amphetamine UR 02/05/2024 None Detected  NONE DETECTED (Cut Off Level 1000 ng/mL) Final   POC Secobarbital (BAR) 02/05/2024 None Detected  NONE DETECTED (Cut Off Level 300 ng/mL) Final   POC Buprenorphine (BUP) 02/05/2024 None Detected  NONE DETECTED (Cut Off Level 10 ng/mL) Final   POC Oxazepam (BZO) 02/05/2024 None Detected  NONE DETECTED (Cut Off Level 300 ng/mL) Final   POC Cocaine UR 02/05/2024 Positive (A)  NONE DETECTED (Cut Off Level 300 ng/mL) Final   POC Methamphetamine UR 02/05/2024 Positive (A)  NONE DETECTED (Cut Off Level 1000 ng/mL) Final   POC Morphine 02/05/2024 None Detected  NONE DETECTED (Cut Off Level 300 ng/mL) Final   POC Methadone UR 02/05/2024 None Detected  NONE DETECTED (Cut Off Level 300 ng/mL) Final   POC Oxycodone UR 02/05/2024 Positive (A)  NONE DETECTED (Cut Off Level 100 ng/mL) Final   POC Marijuana UR 02/05/2024 Positive (A)  NONE DETECTED (Cut Off Level 50 ng/mL) Final    Blood  Alcohol level:  Lab Results  Component Value Date   ETH <15 02/05/2024   ETH 94 (H) 07/09/2022    Metabolic Disorder Labs: No results found for: HGBA1C, MPG No results found for: PROLACTIN Lab Results  Component Value Date   CHOL 206 (H) 02/05/2024   TRIG 133 02/05/2024   HDL 90 02/05/2024   CHOLHDL 2.3 02/05/2024   VLDL 27 02/05/2024   LDLCALC 89 02/05/2024   LDLCALC 106 (H) 07/09/2022    Therapeutic Lab Levels: No results found for: LITHIUM No results found for: VALPROATE No results found for: CBMZ  Physical Findings   PHQ2-9    Flowsheet Row ED from 02/05/2024 in [facility] ED from 07/09/2022 in [facility]  PHQ-2 Total Score 4 2  PHQ-9 Total Score 22 8   Flowsheet Row ED from 02/05/2024 in [facility] Most recent reading at 02/05/2024 11:36 PM ED from 02/05/2024 in [facility] Most recent reading at 02/05/2024 10:17 PM ED from 07/09/2022 in [facility] Most recent reading at 07/13/2022  9:52 AM  C-SSRS RISK CATEGORY No Risk No Risk No Risk     Musculoskeletal  Strength & Muscle Tone: within normal limits Gait & Station: normal Patient leans: N/A  Psychiatric Specialty Exam  Presentation  General Appearance:  Disheveled  Eye Contact: Poor  Speech: Slurred  Speech Volume: Normal  Handedness: Right  Mood and Affect  Mood: Depressed  Affect: Congruent  Thought Process  Thought Processes: Coherent  Descriptions of Associations:Intact  Orientation:Full (Time, Place and Person)  Thought Content:Logical  Diagnosis of Schizophrenia or Schizoaffective disorder in past: No data recorded   Hallucinations:Hallucinations: None  Ideas of Reference:None  Suicidal Thoughts:Suicidal Thoughts: No  Homicidal Thoughts:Homicidal Thoughts: No  Sensorium  Memory: Immediate Fair; Recent Fair; Remote  Poor  Judgment: Impaired  Insight: Shallow  Executive Functions  Concentration: Poor  Attention Span: Poor  Recall: Fair  Fund of Knowledge: Poor  Language: Fair  Psychomotor Activity  Psychomotor Activity: Psychomotor Activity: Normal  Assets  Assets: Desire for Improvement  Sleep  Sleep: Sleep: Fair  Estimated Sleeping Duration (Last 24 Hours): 15.50-16.25 hours  Nutritional Assessment (For OBS and FBC admissions only) Has the patient had a weight loss or gain of 10 pounds or more in the last 3 months?: No Has the patient had a decrease in food intake/or appetite?: No Does the patient  have dental problems?: No Does the patient have eating habits or behaviors that may be indicators of an eating disorder including binging or inducing vomiting?: No Has the patient recently lost weight without trying?: 0 Has the patient been eating poorly because of a decreased appetite?: 0 Malnutrition Screening Tool Score: 0   Physical Exam  Physical Exam ROS Blood pressure 108/81, pulse 73, temperature 98.5 F (36.9 C), temperature source Oral, resp. rate 17, SpO2 98%. There is no height or weight on file to calculate BMI.  Treatment Plan Summary: Short Term Goals: Patient will verbalize feelings in meetings with treatment team members., Patient will attend at least of 50% of the groups daily., Pt will complete the PHQ9 on admission, day 3 and discharge., Patient will participate in completing the [place] Suicide Severity Rating Scale, Patient will score a low risk of violence for 24 hours prior to discharge, and Patient will take medications as prescribed daily. Long Term Goals: Improvement in symptoms so as ready for discharge Monitor response to sertraline . Refer patient for residential substance treatment.  KANDI JAYSON HAHN, MD 02/06/2024 6:54 PM

## 2024-02-06 NOTE — Group Note (Signed)
 Group Topic: Relapse and Recovery  Group Date: 02/06/2024 Start Time: 1915 End Time: 2015 Facilitators: Joan Plowman B  Department: [facility]  Number of Participants: 5  Group Focus: abuse issues and relapse prevention Treatment Modality:  Spiritual Interventions utilized were leisure development, patient education, story telling, and support Purpose: enhance coping skills, increase insight, and relapse prevention strategies  Name: Juan Hood Date of Birth: May 26, 1976  MR: [id number]    Level of Participation: PT DID NOT ATTEND GROUP Quality of Participation: cooperative Interactions with others: gave feedback Mood/Affect: appropriate Triggers (if applicable): NA Cognition: coherent/clear Progress: None Response: NA Plan: patient will be encouraged to go to groups  Patients Problems:  Patient Active Problem List   Diagnosis Date Noted   Polysubstance abuse (HCC) 02/05/2024   GAD (generalized anxiety disorder) 09/26/2022   Cocaine use disorder (HCC) 07/09/2022   Cocaine abuse (HCC) 12/08/2019   Alcohol abuse 12/08/2019   Tobacco abuse 12/08/2019   Acute hypoxic Respiratory failure (HCC) 12/08/2019

## 2024-02-06 NOTE — ED Notes (Signed)
 Pt reports to have eaten breakfast, and feels 'alright'. Pt denied si, hi, avh- verbal contract for safety provided. Pt denied physical pain or discomfort, says he can not remember when last BM was.

## 2024-02-06 NOTE — Group Note (Signed)
 Group Topic: Emotional Regulation  Group Date: 02/06/2024 Start Time: 1710 End Time: 1725 Facilitators: Daved Tinnie HERO, RN  Department: [facility]  Number of Participants: 6  Group Focus: communication Treatment Modality:  Psychoeducation Interventions utilized were patient education Purpose: express feelings  Name: Juan Hood Date of Birth: 21-Sep-1975  MR: [id number]    Level of Participation: moderate Quality of Participation: attentive and cooperative Interactions with others: gave feedback Mood/Affect: appropriate Triggers (if applicable): n/a Cognition: coherent/clear Progress: Significant Response: to manage and increase in awareness of their feelings and emotions, pt says he will take a step back and think first Plan: patient will be encouraged to attend future RN education groups  Patients Problems:  Patient Active Problem List   Diagnosis Date Noted   Polysubstance abuse (HCC) 02/05/2024   GAD (generalized anxiety disorder) 09/26/2022   Cocaine use disorder (HCC) 07/09/2022   Cocaine abuse (HCC) 12/08/2019   Alcohol abuse 12/08/2019   Tobacco abuse 12/08/2019   Acute hypoxic Respiratory failure (HCC) 12/08/2019

## 2024-02-07 DIAGNOSIS — F1910 Other psychoactive substance abuse, uncomplicated: Secondary | ICD-10-CM | POA: Diagnosis not present

## 2024-02-07 DIAGNOSIS — F1190 Opioid use, unspecified, uncomplicated: Secondary | ICD-10-CM | POA: Diagnosis not present

## 2024-02-07 DIAGNOSIS — F1090 Alcohol use, unspecified, uncomplicated: Secondary | ICD-10-CM | POA: Diagnosis not present

## 2024-02-07 DIAGNOSIS — F1490 Cocaine use, unspecified, uncomplicated: Secondary | ICD-10-CM | POA: Diagnosis not present

## 2024-02-07 LAB — HEMOGLOBIN A1C
Hgb A1c MFr Bld: 5.4 % (ref 4.8–5.6)
Mean Plasma Glucose: 108 mg/dL

## 2024-02-07 MED ORDER — SERTRALINE HCL 50 MG PO TABS
50.0000 mg | ORAL_TABLET | Freq: Every day | ORAL | Status: DC
  Administered 2024-02-08 – 2024-02-12 (×5): 50 mg via ORAL
  Filled 2024-02-07 (×4): qty 1
  Filled 2024-02-07: qty 14
  Filled 2024-02-07: qty 1

## 2024-02-07 MED ORDER — TRAZODONE HCL 50 MG PO TABS
50.0000 mg | ORAL_TABLET | Freq: Every evening | ORAL | Status: DC | PRN
  Administered 2024-02-07 – 2024-02-10 (×4): 50 mg via ORAL
  Filled 2024-02-07 (×3): qty 1

## 2024-02-07 MED ORDER — NICOTINE POLACRILEX 2 MG MT GUM: 2.0000 mg | CHEWING_GUM | OROMUCOSAL | Status: DC | PRN

## 2024-02-07 NOTE — ED Notes (Signed)
 Patient is sleeping no problem noted.

## 2024-02-07 NOTE — ED Notes (Signed)
 No sleep disturbances, he is still sleeping

## 2024-02-07 NOTE — ED Notes (Signed)
 Patient is doing well. Denied sleep disturbances. Nutrition intake adequate. He is alert and oriented. No issus of hygiene. Speech clear. He interacts with peers. Denied SI/HI and audiovisual hallucination. Denied sleep disturbances. BM remains as usual. Med compliant. No observed problem at this time.

## 2024-02-07 NOTE — Group Note (Signed)
 Group Topic: Relapse and Recovery  Group Date: 02/07/2024 Start Time: 2000 End Time: 2100 Facilitators: Joan Plowman B  Department: [facility]  Number of Participants: 8  Group Focus: abuse issues and daily focus Treatment Modality:  Leisure Development Interventions utilized were patient education, story telling, and support Purpose: express feelings  Name: MURPHY DUZAN Date of Birth: Jun 19, 1976  MR: [id number]    Level of Participation: active Quality of Participation: attentive and cooperative Interactions with others: gave feedback Mood/Affect: appropriate Triggers (if applicable): NA Cognition: coherent/clear Progress: Gaining insight Response: NA Plan: patient will be encouraged to keep going to groups  Patients Problems:  Patient Active Problem List   Diagnosis Date Noted   Polysubstance abuse (HCC) 02/05/2024   GAD (generalized anxiety disorder) 09/26/2022   Cocaine use disorder (HCC) 07/09/2022   Cocaine abuse (HCC) 12/08/2019   Alcohol abuse 12/08/2019   Tobacco abuse 12/08/2019   Acute hypoxic Respiratory failure (HCC) 12/08/2019

## 2024-02-07 NOTE — ED Notes (Signed)
 Patient is sleeping. Respirations equal and unlabored. No change in assessment or acuity. Routine safety checks conducted according to facility protocol.

## 2024-02-07 NOTE — ED Notes (Signed)
 Patient was provided dinner

## 2024-02-07 NOTE — ED Provider Notes (Signed)
 Behavioral Health Progress Note  Date and Time: 02/07/2024 4:25 PM Name: Juan Hood MRN:  [id number]  Subjective:   Reports that he is still having cravings for cocaine. Apetite has been okay but is only eating once a day. Cravings for cigarettes and would like to start nicotine  gum. Reports his vision is still blurry. Regarding alc withdrawal, having diaphoresis but denies n/v/d/tremors.   Diagnosis:  Final diagnoses:  Polysubstance abuse (HCC)  Homelessness  Alcohol use disorder  Crack cocaine use  Opioid use disorder   HPI:  Juan Hood is a 48 year old male with psychiatric history of polysubstance abuse-cocaine, alcohol, tobacco, GAD, Homelessness, and 1 Accidental Overdose (6/21),who presented voluntarily as a walk in to [facility] seeking detox/substance use treatment from crack cocaine and alcohol.    Patient was seen face to face by this provider and chart reviewed.  Per chart review, he started using crack cocaine and smoking marijuana around age 62 and drinking at age 4. Patient denies that he has had any sustained periods of sobriety in his life. Patient reports that he is unemployed and homeless (6-7 years  I'm staying wherever I can stay).  Patient has experienced alcohol withdrawal symptoms of nausea, tremors and has had blackouts from drinking excessively in the past. Patient denies experiencing any seizures.    Tonight, patient is slurring his words, inattentive and appears nonchalant.  He has difficulty finding his words.   He states I came for.............., what is that they call when you want to get off substance?  Patient is asked if he is seeking detox and he replies yes.  Patient reports alcohol use daily for the past 4 years and crack cocaine use daily for the past 4 years.  Last use of both substances 2 days ago. Patient reports he drinks about a case of beer a day if I could, I do whenever I can get it.Patient denies current withdrawal symptoms.    Patient denies other illicit substance use.  He denies access to gun.  Patient reports he is not established with outpatient psychiatric providers and is not taking any medications.   He report briefly going to [facility] in the past.    Patient completed the PHQ-9 questionnaire and obtained a total score of 22, indicating severe depression.   Total Time spent with patient: I personally spent 40 minutes on the unit in direct patient care. The direct patient care time included face-to-face time with the patient, reviewing the patient's chart, communicating with other professionals, and coordinating care. Greater than 50% of this time was spent in counseling or coordinating care with the patient regarding goals of hospitalization, psycho-education, and discharge planning needs.  Past Psychiatric History: polysubstance use disorder (EtOH, cocaine), GAD Past Medical History: acute hypoxic respiratory failure (accidental overdose) Social History: homeless  Sleep: Fair  Appetite:  Fair  Current Medications:  Current Facility-Administered Medications  Medication Dose Route Frequency Provider Last Rate Last Admin   alum & mag hydroxide-simeth (MAALOX/MYLANTA) 200-200-20 MG/5ML suspension 30 mL  30 mL Oral Q4H PRN Onuoha, Chinwendu V, NP       chlordiazePOXIDE  (LIBRIUM ) capsule 25 mg  25 mg Oral Q6H PRN Onuoha, Chinwendu V, NP       dicyclomine  (BENTYL ) tablet 20 mg  20 mg Oral Q6H PRN Onuoha, Chinwendu V, NP       haloperidol  (HALDOL ) tablet 5 mg  5 mg Oral TID PRN Onuoha, Chinwendu V, NP       And   diphenhydrAMINE  (  BENADRYL ) capsule 50 mg  50 mg Oral TID PRN Onuoha, Chinwendu V, NP       haloperidol  lactate (HALDOL ) injection 5 mg  5 mg Intramuscular TID PRN Onuoha, Chinwendu V, NP       And   diphenhydrAMINE  (BENADRYL ) injection 50 mg  50 mg Intramuscular TID PRN Onuoha, Chinwendu V, NP       And   LORazepam  (ATIVAN ) injection 2 mg  2 mg Intramuscular TID PRN Onuoha, Chinwendu V, NP        haloperidol  lactate (HALDOL ) injection 10 mg  10 mg Intramuscular TID PRN Onuoha, Chinwendu V, NP       And   diphenhydrAMINE  (BENADRYL ) injection 50 mg  50 mg Intramuscular TID PRN Onuoha, Chinwendu V, NP       And   LORazepam  (ATIVAN ) injection 2 mg  2 mg Intramuscular TID PRN Onuoha, Chinwendu V, NP       hydrOXYzine  (ATARAX ) tablet 25 mg  25 mg Oral Q6H PRN Onuoha, Chinwendu V, NP       loperamide  (IMODIUM ) capsule 2-4 mg  2-4 mg Oral PRN Onuoha, Chinwendu V, NP       magnesium  hydroxide (MILK OF MAGNESIA) suspension 30 mL  30 mL Oral Daily PRN Onuoha, Chinwendu V, NP       methocarbamol  (ROBAXIN ) tablet 500 mg  500 mg Oral Q8H PRN Onuoha, Chinwendu V, NP       multivitamin with minerals tablet 1 tablet  1 tablet Oral Daily Onuoha, Chinwendu V, NP   1 tablet at 02/07/24 0941   naproxen  (NAPROSYN ) tablet 500 mg  500 mg Oral BID PRN Onuoha, Chinwendu V, NP       nicotine  polacrilex (NICORETTE ) gum 2 mg  2 mg Oral PRN Lavetta Geier, MD       ondansetron  (ZOFRAN -ODT) disintegrating tablet 4 mg  4 mg Oral Q6H PRN Onuoha, Chinwendu V, NP       sertraline  (ZOLOFT ) tablet 25 mg  25 mg Oral Daily Onuoha, Chinwendu V, NP   25 mg at 02/07/24 0942   traZODone  (DESYREL ) tablet 50 mg  50 mg Oral QHS PRN Lashonna Rieke, MD       No current outpatient medications on file.    Labs  Lab Results:  Admission on 02/05/2024  Component Date Value Ref Range Status   WBC 02/05/2024 5.3  4.0 - 10.5 K/uL Final   RBC 02/05/2024 4.97  4.22 - 5.81 MIL/uL Final   Hemoglobin 02/05/2024 16.8  13.0 - 17.0 g/dL Final   HCT 91/88/7974 48.5  39.0 - 52.0 % Final   MCV 02/05/2024 97.6  80.0 - 100.0 fL Final   MCH 02/05/2024 33.8  26.0 - 34.0 pg Final   MCHC 02/05/2024 34.6  30.0 - 36.0 g/dL Final   RDW 91/88/7974 12.2  11.5 - 15.5 % Final   Platelets 02/05/2024 263  150 - 400 K/uL Final   nRBC 02/05/2024 0.0  0.0 - 0.2 % Final   Neutrophils Relative % 02/05/2024 38  % Final   Neutro Abs 02/05/2024 2.0  1.7 - 7.7  K/uL Final   Lymphocytes Relative 02/05/2024 48  % Final   Lymphs Abs 02/05/2024 2.5  0.7 - 4.0 K/uL Final   Monocytes Relative 02/05/2024 9  % Final   Monocytes Absolute 02/05/2024 0.5  0.1 - 1.0 K/uL Final   Eosinophils Relative 02/05/2024 4  % Final   Eosinophils Absolute 02/05/2024 0.2  0.0 - 0.5 K/uL Final   Basophils  Relative 02/05/2024 1  % Final   Basophils Absolute 02/05/2024 0.1  0.0 - 0.1 K/uL Final   Immature Granulocytes 02/05/2024 0  % Final   Abs Immature Granulocytes 02/05/2024 0.02  0.00 - 0.07 K/uL Final   Performed at [facility], [address] [place], [place] [address]   Sodium 02/05/2024 138  135 - 145 mmol/L Final   Potassium 02/05/2024 5.0  3.5 - 5.1 mmol/L Final   Chloride 02/05/2024 103  98 - 111 mmol/L Final   CO2 02/05/2024 26  22 - 32 mmol/L Final   Glucose, Bld 02/05/2024 76  70 - 99 mg/dL Final   Glucose reference range applies only to samples taken after fasting for at least 8 hours.   BUN 02/05/2024 8  6 - 20 mg/dL Final   Creatinine, Ser 02/05/2024 0.98  0.61 - 1.24 mg/dL Final   Calcium 91/88/7974 9.1  8.9 - 10.3 mg/dL Final   Total Protein 91/88/7974 6.9  6.5 - 8.1 g/dL Final   Albumin 91/88/7974 3.4 (L)  3.5 - 5.0 g/dL Final   AST 91/88/7974 34  15 - 41 U/L Final   ALT 02/05/2024 41  0 - 44 U/L Final   Alkaline Phosphatase 02/05/2024 91  38 - 126 U/L Final   Total Bilirubin 02/05/2024 1.1  0.0 - 1.2 mg/dL Final   GFR, Estimated 02/05/2024 >60  >60 mL/min Final   Comment: (NOTE) Calculated using the CKD-EPI Creatinine Equation (2021)    Anion gap 02/05/2024 9  5 - 15 Final   Performed at [facility], [address], [place], [place] [address]   Hgb A1c MFr Bld 02/05/2024 5.4  4.8 - 5.6 % Final   Comment: (NOTE)         Prediabetes: 5.7 - 6.4         Diabetes: >6.4         Glycemic control for adults with diabetes: <7.0    Mean Plasma Glucose 02/05/2024 108  mg/dL Final   Comment: (NOTE) Performed At: [facility] [address] [place], [place] [id number] Jennette Shorter MD Ey:[id number]    Alcohol, Ethyl (B) 02/05/2024 <15  <15 mg/dL Final   Comment: (NOTE) For medical purposes only. Performed at [facility], [address] [place], [place] [address]    Cholesterol 02/05/2024 206 (H)  0 - 200 mg/dL Final   Triglycerides 91/88/7974 133  <150 mg/dL Final   HDL 91/88/7974 90  >40 mg/dL Final   Total CHOL/HDL Ratio 02/05/2024 2.3  RATIO Final   VLDL 02/05/2024 27  0 - 40 mg/dL Final   LDL Cholesterol 02/05/2024 89  0 - 99 mg/dL Final   Comment:        Total Cholesterol/HDL:CHD Risk Coronary Heart Disease Risk Table                     Men   Women  1/2 Average Risk   3.4   3.3  Average Risk       5.0   4.4  2 X Average Risk   9.6   7.1  3 X Average Risk  23.4   11.0        Use the calculated Patient Ratio above and the CHD Risk Table to determine the patient's CHD Risk.        ATP III CLASSIFICATION (LDL):  <100     mg/dL   Optimal  899-870  mg/dL  Near or Above                    Optimal  130-159  mg/dL   Borderline  839-810  mg/dL   High  >809     mg/dL   Very High Performed at [facility], [address], [place], [place] [address]    TSH 02/05/2024 0.692  0.350 - 4.500 uIU/mL Final   Comment: Performed by a 3rd Generation assay with a functional sensitivity of <=0.01 uIU/mL. Performed at [facility], [address], [place], [place] [address]    POC Amphetamine UR 02/05/2024 None Detected  NONE DETECTED (Cut Off Level 1000 ng/mL) Final   POC Secobarbital (BAR) 02/05/2024 None Detected  NONE DETECTED (Cut Off Level 300 ng/mL) Final   POC Buprenorphine (BUP) 02/05/2024 None Detected  NONE DETECTED (Cut Off Level 10 ng/mL) Final   POC Oxazepam (BZO) 02/05/2024 None Detected  NONE DETECTED (Cut Off Level 300 ng/mL) Final   POC Cocaine UR 02/05/2024 Positive (A)  NONE DETECTED (Cut Off Level 300 ng/mL) Final   POC Methamphetamine UR 02/05/2024 Positive  (A)  NONE DETECTED (Cut Off Level 1000 ng/mL) Final   POC Morphine 02/05/2024 None Detected  NONE DETECTED (Cut Off Level 300 ng/mL) Final   POC Methadone UR 02/05/2024 None Detected  NONE DETECTED (Cut Off Level 300 ng/mL) Final   POC Oxycodone UR 02/05/2024 Positive (A)  NONE DETECTED (Cut Off Level 100 ng/mL) Final   POC Marijuana UR 02/05/2024 Positive (A)  NONE DETECTED (Cut Off Level 50 ng/mL) Final    Blood Alcohol level:  Lab Results  Component Value Date   ETH <15 02/05/2024   ETH 94 (H) 07/09/2022    Metabolic Disorder Labs: Lab Results  Component Value Date   HGBA1C 5.4 02/05/2024   MPG 108 02/05/2024   No results found for: PROLACTIN Lab Results  Component Value Date   CHOL 206 (H) 02/05/2024   TRIG 133 02/05/2024   HDL 90 02/05/2024   CHOLHDL 2.3 02/05/2024   VLDL 27 02/05/2024   LDLCALC 89 02/05/2024   LDLCALC 106 (H) 07/09/2022    Therapeutic Lab Levels: No results found for: LITHIUM No results found for: VALPROATE No results found for: CBMZ  Physical Findings   PHQ2-9    Flowsheet Row ED from 02/05/2024 in [facility] ED from 07/09/2022 in [facility]  PHQ-2 Total Score 4 2  PHQ-9 Total Score 22 8   Flowsheet Row ED from 02/05/2024 in [facility] Most recent reading at 02/05/2024 11:36 PM ED from 02/05/2024 in [facility] Most recent reading at 02/05/2024 10:17 PM ED from 07/09/2022 in [facility] Most recent reading at 07/13/2022  9:52 AM  C-SSRS RISK CATEGORY No Risk No Risk No Risk     Musculoskeletal  Strength & Muscle Tone: within normal limits Gait & Station: normal Patient leans: N/A  Psychiatric Specialty Exam  Presentation  General Appearance:  Disheveled  Eye Contact: Poor  Speech: Slurred  Speech Volume: Normal  Handedness: Right  Mood and Affect   Mood: Depressed  Affect: Congruent  Thought Process  Thought Processes: Coherent  Descriptions of Associations:Intact  Orientation:Full (Time, Place and Person)  Thought Content:Logical  Diagnosis of Schizophrenia or Schizoaffective disorder in past: No data recorded   Hallucinations:Hallucinations: None  Ideas of Reference:None  Suicidal Thoughts:Suicidal Thoughts: No  Homicidal Thoughts:Homicidal Thoughts: No  Sensorium  Memory: Immediate Fair; Recent Fair; Remote Poor  Judgment: Impaired  Insight: Shallow  Executive Functions  Concentration: Poor  Attention Span: Poor  Recall: Fair  Fund of Knowledge: Poor  Language: Fair  Psychomotor Activity  Psychomotor Activity: Psychomotor Activity: Normal  Assets  Assets: Desire for Improvement  Sleep  Sleep: Sleep: Fair  Estimated Sleeping Duration (Last 24 Hours): 13.75-14.75 hours  No data recorded  Physical Exam  Physical Exam Vitals and nursing note reviewed.  Constitutional:      General: He is not in acute distress.    Appearance: He is well-developed.  HENT:     Head: Normocephalic and atraumatic.  Eyes:     Extraocular Movements: Extraocular movements intact.     Pupils: Pupils are equal, round, and reactive to light.     Comments: Hematoma on sclera superior to pupil / iris. Decreased visual acuity in the R eye but can still read at probably a 20 / 80.   Pulmonary:     Effort: Pulmonary effort is normal. No respiratory distress.  Musculoskeletal:        General: No swelling.  Neurological:     General: No focal deficit present.     Mental Status: He is alert.    Review of Systems  Constitutional:  Positive for diaphoresis. Negative for fever.  Cardiovascular:  Negative for chest pain and palpitations.  Gastrointestinal:  Negative for constipation, diarrhea, nausea and vomiting.  Neurological:  Negative for dizziness, weakness and headaches.   Blood pressure 114/74,  pulse 72, temperature 99 F (37.2 C), temperature source Oral, resp. rate 18, SpO2 96%. There is no height or weight on file to calculate BMI.  Treatment Plan Summary: Short Term Goals: Patient will verbalize feelings in meetings with treatment team members., Patient will attend at least of 50% of the groups daily., Pt will complete the PHQ9 on admission, day 3 and discharge., Patient will participate in completing the [place] Suicide Severity Rating Scale, Patient will score a low risk of violence for 24 hours prior to discharge, and Patient will take medications as prescribed daily. Long Term Goals: Improvement in symptoms so as ready for discharge Monitor response to sertraline . Refer patient for residential substance treatment.  Continue  blurring vision that is likely benign and part of natural process of eye trauma healing but will moniter closely given decreased R eye visual acuity, although improving. Reassurance with normal EOM, intact visual fields, and no severe pain. Aside from diaphoresis, pt is not having significant alcohol withdrawal symptoms. Tolerating zoloft  without side effect so will increase to 50 mg to ensure therapeutic dose before discharge. Will discuss naltrexone . Transfer to residential once stable.  Continue CIWA protocol with Librium  PRN Continue COWS protocol Increase Zoloft  to 50 mg once daily for MDD Continue nicotine  gum Monitor right eye acuity for improvement  Justino Cornish, MD 02/07/2024 4:25 PM

## 2024-02-07 NOTE — Group Note (Signed)
 Group Topic: Recovery Basics  Group Date: 02/07/2024 Start Time: 1210 End Time: 1310 Facilitators: Stanly Stabile, RN  Department: [facility]  Number of Participants: 9  Group Focus: affirmation, chemical dependency education, and chemical dependency issues Treatment Modality:  Behavior Modification Therapy Interventions utilized were clarification and patient education Purpose: enhance coping skills, explore maladaptive thinking, express feelings, express irrational fears, regain self-worth, reinforce self-care, and relapse prevention strategies  Name: DASEAN BROW Date of Birth: [birth year]  MR: [id number]    Level of Participation: minimal Quality of Participation: attentive and cooperative Interactions with others: gave feedback Mood/Affect: appropriate Triggers (if applicable):   Cognition: coherent/clear and goal directed Progress: Gaining insight Response:   Plan: follow-up needed  Patients Problems:  Patient Active Problem List   Diagnosis Date Noted   Polysubstance abuse (HCC) 02/05/2024   GAD (generalized anxiety disorder) 09/26/2022   Cocaine use disorder (HCC) 07/09/2022   Cocaine abuse (HCC) 12/08/2019   Alcohol abuse 12/08/2019   Tobacco abuse 12/08/2019   Acute hypoxic Respiratory failure (HCC) 12/08/2019

## 2024-02-07 NOTE — ED Notes (Signed)
 Patient awake for breakfast and morning meeting.  No issues or complaints.  No withdrawal.  Will monitor.

## 2024-02-08 DIAGNOSIS — F1090 Alcohol use, unspecified, uncomplicated: Secondary | ICD-10-CM | POA: Diagnosis not present

## 2024-02-08 DIAGNOSIS — F1490 Cocaine use, unspecified, uncomplicated: Secondary | ICD-10-CM | POA: Diagnosis not present

## 2024-02-08 DIAGNOSIS — F1190 Opioid use, unspecified, uncomplicated: Secondary | ICD-10-CM | POA: Diagnosis not present

## 2024-02-08 DIAGNOSIS — F1910 Other psychoactive substance abuse, uncomplicated: Secondary | ICD-10-CM | POA: Diagnosis not present

## 2024-02-08 NOTE — ED Notes (Signed)
 Pt continues to display depressed affect, however, that been calm and cooperative this shift. Pt ate lunch.

## 2024-02-08 NOTE — ED Notes (Signed)
 Patient asleep in the bedroom. NAD Will monitor for safety.

## 2024-02-08 NOTE — ED Notes (Addendum)
 Pt says he feels 'alright'. Pt denies si hi and avh- verbal contract for safety provided. Pt affirmed to have eaten breakfast. Pt denies physical discomforts and pain.

## 2024-02-08 NOTE — Group Note (Signed)
 Group Topic: Identity and Relationships  Group Date: 02/08/2024 Start Time: 1710 End Time: 1735 Facilitators: Daved Tinnie HERO, RN  Department: [facility]  Number of Participants: 6  Group Focus: family, healthy friendships, and nursing group Treatment Modality:  Psychoeducation Interventions utilized were patient education and story telling Purpose: increase insight  Name: Juan Hood Date of Birth: [birth year]  MR: [id number]    Level of Participation: active Quality of Participation: attentive and cooperative Interactions with others: gave feedback Mood/Affect: appropriate Triggers (if applicable): n/a Cognition: coherent/clear Progress: Significant Response: to maintain and attract healthy relationships, pt says they will be more honest Plan: patient will be encouraged to attend future RN education groups  Patients Problems:  Patient Active Problem List   Diagnosis Date Noted   Polysubstance abuse (HCC) 02/05/2024   GAD (generalized anxiety disorder) 09/26/2022   Cocaine use disorder (HCC) 07/09/2022   Cocaine abuse (HCC) 12/08/2019   Alcohol abuse 12/08/2019   Tobacco abuse 12/08/2019   Acute hypoxic Respiratory failure (HCC) 12/08/2019

## 2024-02-08 NOTE — ED Provider Notes (Signed)
 Behavioral Health Progress Note  Date and Time: 02/08/2024 12:38 PM Name: Juan Hood MRN:  [id number]  Subjective:  Juan Hood was seen in his room on rounds. He is sleeping and eating okay. He is still having some sweats from withdrawal, but it is overall improved. No other new physical symptoms. No issues with medication side effects. No hallucinations, thoughts of harm to self or others.   Diagnosis:  Final diagnoses:  Polysubstance abuse (HCC)  Homelessness  Alcohol use disorder  Crack cocaine use  Opioid use disorder    Total Time spent with patient: 15 minutes   Past Psychiatric History: polysubstance use disorder (EtOH, cocaine), GAD Past Medical History: acute hypoxic respiratory failure (accidental overdose) Social History: homeless  Sleep: Good  Appetite:  Good  Current Medications:  Current Facility-Administered Medications  Medication Dose Route Frequency Provider Last Rate Last Admin   alum & mag hydroxide-simeth (MAALOX/MYLANTA) 200-200-20 MG/5ML suspension 30 mL  30 mL Oral Q4H PRN Onuoha, Chinwendu V, NP       chlordiazePOXIDE  (LIBRIUM ) capsule 25 mg  25 mg Oral Q6H PRN Onuoha, Chinwendu V, NP       dicyclomine  (BENTYL ) tablet 20 mg  20 mg Oral Q6H PRN Onuoha, Chinwendu V, NP       haloperidol  (HALDOL ) tablet 5 mg  5 mg Oral TID PRN Onuoha, Chinwendu V, NP       And   diphenhydrAMINE  (BENADRYL ) capsule 50 mg  50 mg Oral TID PRN Onuoha, Chinwendu V, NP       haloperidol  lactate (HALDOL ) injection 5 mg  5 mg Intramuscular TID PRN Onuoha, Chinwendu V, NP       And   diphenhydrAMINE  (BENADRYL ) injection 50 mg  50 mg Intramuscular TID PRN Onuoha, Chinwendu V, NP       And   LORazepam  (ATIVAN ) injection 2 mg  2 mg Intramuscular TID PRN Onuoha, Chinwendu V, NP       haloperidol  lactate (HALDOL ) injection 10 mg  10 mg Intramuscular TID PRN Onuoha, Chinwendu V, NP       And   diphenhydrAMINE  (BENADRYL ) injection 50 mg  50 mg Intramuscular TID PRN Onuoha, Chinwendu  V, NP       And   LORazepam  (ATIVAN ) injection 2 mg  2 mg Intramuscular TID PRN Onuoha, Chinwendu V, NP       hydrOXYzine  (ATARAX ) tablet 25 mg  25 mg Oral Q6H PRN Onuoha, Chinwendu V, NP       loperamide  (IMODIUM ) capsule 2-4 mg  2-4 mg Oral PRN Onuoha, Chinwendu V, NP       magnesium  hydroxide (MILK OF MAGNESIA) suspension 30 mL  30 mL Oral Daily PRN Onuoha, Chinwendu V, NP       methocarbamol  (ROBAXIN ) tablet 500 mg  500 mg Oral Q8H PRN Onuoha, Chinwendu V, NP       multivitamin with minerals tablet 1 tablet  1 tablet Oral Daily Onuoha, Chinwendu V, NP   1 tablet at 02/08/24 9078   naproxen  (NAPROSYN ) tablet 500 mg  500 mg Oral BID PRN Onuoha, Chinwendu V, NP       nicotine  polacrilex (NICORETTE ) gum 2 mg  2 mg Oral PRN McCarty, Artie, MD       ondansetron  (ZOFRAN -ODT) disintegrating tablet 4 mg  4 mg Oral Q6H PRN Onuoha, Chinwendu V, NP       sertraline  (ZOLOFT ) tablet 50 mg  50 mg Oral Daily McCarty, Artie, MD   50 mg at 02/08/24 [id number]  traZODone  (DESYREL ) tablet 50 mg  50 mg Oral QHS PRN McCarty, Artie, MD   50 mg at 02/07/24 2119   No current outpatient medications on file.    Labs  Lab Results:  Admission on 02/05/2024  Component Date Value Ref Range Status   WBC 02/05/2024 5.3  4.0 - 10.5 K/uL Final   RBC 02/05/2024 4.97  4.22 - 5.81 MIL/uL Final   Hemoglobin 02/05/2024 16.8  13.0 - 17.0 g/dL Final   HCT 91/88/7974 48.5  39.0 - 52.0 % Final   MCV 02/05/2024 97.6  80.0 - 100.0 fL Final   MCH 02/05/2024 33.8  26.0 - 34.0 pg Final   MCHC 02/05/2024 34.6  30.0 - 36.0 g/dL Final   RDW 91/88/7974 12.2  11.5 - 15.5 % Final   Platelets 02/05/2024 263  150 - 400 K/uL Final   nRBC 02/05/2024 0.0  0.0 - 0.2 % Final   Neutrophils Relative % 02/05/2024 38  % Final   Neutro Abs 02/05/2024 2.0  1.7 - 7.7 K/uL Final   Lymphocytes Relative 02/05/2024 48  % Final   Lymphs Abs 02/05/2024 2.5  0.7 - 4.0 K/uL Final   Monocytes Relative 02/05/2024 9  % Final   Monocytes Absolute 02/05/2024 0.5   0.1 - 1.0 K/uL Final   Eosinophils Relative 02/05/2024 4  % Final   Eosinophils Absolute 02/05/2024 0.2  0.0 - 0.5 K/uL Final   Basophils Relative 02/05/2024 1  % Final   Basophils Absolute 02/05/2024 0.1  0.0 - 0.1 K/uL Final   Immature Granulocytes 02/05/2024 0  % Final   Abs Immature Granulocytes 02/05/2024 0.02  0.00 - 0.07 K/uL Final   Performed at [facility], [address] [place], [place] [address]   Sodium 02/05/2024 138  135 - 145 mmol/L Final   Potassium 02/05/2024 5.0  3.5 - 5.1 mmol/L Final   Chloride 02/05/2024 103  98 - 111 mmol/L Final   CO2 02/05/2024 26  22 - 32 mmol/L Final   Glucose, Bld 02/05/2024 76  70 - 99 mg/dL Final   Glucose reference range applies only to samples taken after fasting for at least 8 hours.   BUN 02/05/2024 8  6 - 20 mg/dL Final   Creatinine, Ser 02/05/2024 0.98  0.61 - 1.24 mg/dL Final   Calcium 91/88/7974 9.1  8.9 - 10.3 mg/dL Final   Total Protein 91/88/7974 6.9  6.5 - 8.1 g/dL Final   Albumin 91/88/7974 3.4 (L)  3.5 - 5.0 g/dL Final   AST 91/88/7974 34  15 - 41 U/L Final   ALT 02/05/2024 41  0 - 44 U/L Final   Alkaline Phosphatase 02/05/2024 91  38 - 126 U/L Final   Total Bilirubin 02/05/2024 1.1  0.0 - 1.2 mg/dL Final   GFR, Estimated 02/05/2024 >60  >60 mL/min Final   Comment: (NOTE) Calculated using the CKD-EPI Creatinine Equation (2021)    Anion gap 02/05/2024 9  5 - 15 Final   Performed at [facility], [address], [place], [place] [address]   Hgb A1c MFr Bld 02/05/2024 5.4  4.8 - 5.6 % Final   Comment: (NOTE)         Prediabetes: 5.7 - 6.4         Diabetes: >6.4         Glycemic control for adults with diabetes: <7.0    Mean Plasma Glucose 02/05/2024 108  mg/dL Final   Comment: (NOTE) Performed At: [facility]  [place] [address] [place], [place] [id number] Jennette Shorter MD Ey:[id number]    Alcohol, Ethyl (B) 02/05/2024 <15  <15 mg/dL Final   Comment: (NOTE) For medical purposes only. Performed at  [facility], [address] [place], [place] [address]    Cholesterol 02/05/2024 206 (H)  0 - 200 mg/dL Final   Triglycerides 91/88/7974 133  <150 mg/dL Final   HDL 91/88/7974 90  >40 mg/dL Final   Total CHOL/HDL Ratio 02/05/2024 2.3  RATIO Final   VLDL 02/05/2024 27  0 - 40 mg/dL Final   LDL Cholesterol 02/05/2024 89  0 - 99 mg/dL Final   Comment:        Total Cholesterol/HDL:CHD Risk Coronary Heart Disease Risk Table                     Men   Women  1/2 Average Risk   3.4   3.3  Average Risk       5.0   4.4  2 X Average Risk   9.6   7.1  3 X Average Risk  23.4   11.0        Use the calculated Patient Ratio above and the CHD Risk Table to determine the patient's CHD Risk.        ATP III CLASSIFICATION (LDL):  <100     mg/dL   Optimal  899-870  mg/dL   Near or Above                    Optimal  130-159  mg/dL   Borderline  839-810  mg/dL   High  >809     mg/dL   Very High Performed at [facility], [address] [place], [place] [address]    TSH 02/05/2024 0.692  0.350 - 4.500 uIU/mL Final   Comment: Performed by a 3rd Generation assay with a functional sensitivity of <=0.01 uIU/mL. Performed at [facility], [address] [place], [place] [address]    POC Amphetamine UR 02/05/2024 None Detected  NONE DETECTED (Cut Off Level 1000 ng/mL) Final   POC Secobarbital (BAR) 02/05/2024 None Detected  NONE DETECTED (Cut Off Level 300 ng/mL) Final   POC Buprenorphine (BUP) 02/05/2024 None Detected  NONE DETECTED (Cut Off Level 10 ng/mL) Final   POC Oxazepam (BZO) 02/05/2024 None Detected  NONE DETECTED (Cut Off Level 300 ng/mL) Final   POC Cocaine UR 02/05/2024 Positive (A)  NONE DETECTED (Cut Off Level 300 ng/mL) Final   POC Methamphetamine UR 02/05/2024 Positive (A)  NONE DETECTED (Cut Off Level 1000 ng/mL) Final   POC Morphine 02/05/2024 None Detected  NONE DETECTED (Cut Off Level 300 ng/mL) Final   POC Methadone UR 02/05/2024 None Detected  NONE DETECTED  (Cut Off Level 300 ng/mL) Final   POC Oxycodone UR 02/05/2024 Positive (A)  NONE DETECTED (Cut Off Level 100 ng/mL) Final   POC Marijuana UR 02/05/2024 Positive (A)  NONE DETECTED (Cut Off Level 50 ng/mL) Final    Blood Alcohol level:  Lab Results  Component Value Date   ETH <15 02/05/2024   ETH 94 (H) 07/09/2022    Metabolic Disorder Labs: Lab Results  Component Value Date   HGBA1C 5.4 02/05/2024   MPG 108 02/05/2024   No results found for: PROLACTIN Lab Results  Component Value Date   CHOL 206 (H) 02/05/2024   TRIG 133 02/05/2024   HDL 90 02/05/2024   CHOLHDL 2.3 02/05/2024  VLDL 27 02/05/2024   LDLCALC 89 02/05/2024   LDLCALC 106 (H) 07/09/2022    Therapeutic Lab Levels: No results found for: LITHIUM No results found for: VALPROATE No results found for: CBMZ  Physical Findings   PHQ2-9    Flowsheet Row ED from 02/05/2024 in [facility] ED from 07/09/2022 in [facility]  PHQ-2 Total Score 4 2  PHQ-9 Total Score 22 8   Flowsheet Row ED from 02/05/2024 in [facility] Most recent reading at 02/05/2024 11:36 PM ED from 02/05/2024 in [facility] Most recent reading at 02/05/2024 10:17 PM ED from 07/09/2022 in [facility] Most recent reading at 07/13/2022  9:52 AM  C-SSRS RISK CATEGORY No Risk No Risk No Risk     Musculoskeletal  Strength & Muscle Tone: within normal limits Gait & Station: normal Patient leans: N/A  Psychiatric Specialty Exam  Presentation  General Appearance:  Casual  Eye Contact: Fair  Speech: Normal Rate  Speech Volume: Normal  Handedness: Right   Mood and Affect  Mood: Euthymic  Affect: Blunt   Thought Process  Thought Processes: Linear  Descriptions of Associations:Intact  Orientation:Full (Time, Place and Person)  Thought Content:Logical  Diagnosis of Schizophrenia  or Schizoaffective disorder in past: No data recorded   Hallucinations:Hallucinations: None  Ideas of Reference:None  Suicidal Thoughts:Suicidal Thoughts: No  Homicidal Thoughts:Homicidal Thoughts: No   Sensorium  Memory: Immediate Fair; Recent Fair  Judgment: Fair  Insight: Fair   Art therapist  Concentration: Fair  Attention Span: Fair  Recall: Fair  Fund of Knowledge: Good  Language: Good   Psychomotor Activity  Psychomotor Activity: Psychomotor Activity: Normal   Assets  Assets: Communication Skills; Desire for Improvement   Sleep  Sleep: Sleep: Good  Estimated Sleeping Duration (Last 24 Hours): 10.50-12.00 hours  No data recorded  Physical Exam  Physical Exam Vitals and nursing note reviewed.  Constitutional:      Appearance: Normal appearance.  HENT:     Head: Normocephalic and atraumatic.  Eyes:     Extraocular Movements: Extraocular movements intact.  Pulmonary:     Effort: Pulmonary effort is normal.  Musculoskeletal:        General: Normal range of motion.     Cervical back: Normal range of motion.  Neurological:     Mental Status: He is alert and oriented to person, place, and time.    Review of Systems  Constitutional:  Positive for chills.  Respiratory:  Negative for cough.   Gastrointestinal:  Negative for constipation, diarrhea, nausea and vomiting.  Genitourinary:  Negative for dysuria.  Musculoskeletal:  Negative for joint pain and myalgias.  Skin:  Negative for rash.  Neurological:  Negative for headaches.   Blood pressure 114/66, pulse 84, temperature 98.2 F (36.8 C), temperature source Oral, resp. rate 17, SpO2 100%. There is no height or weight on file to calculate BMI.  Treatment Plan Summary: Short Term Goals: Patient will verbalize feelings in meetings with treatment team members., Patient will attend at least of 50% of the groups daily., Pt will complete the PHQ9 on admission, day 3 and discharge.,  Patient will participate in completing the [place] Suicide Severity Rating Scale, Patient will score a low risk of violence for 24 hours prior to discharge, and Patient will take medications as prescribed daily. Long Term Goals: Improvement in symptoms so as ready for discharge Monitor response to sertraline . Refer patient for residential substance  treatment.   Transfer to residential when treatment facility located and accepted Continue CIWA protocol with Librium  PRN Continue COWS protocol Increased Zoloft  to 50 mg once daily for MDD Continue nicotine  gum Monitor right eye acuity for improvement  Corean Anette Potters, MD 02/08/2024 12:38 PM

## 2024-02-08 NOTE — Group Note (Signed)
 Group Topic: Recovery Basics  Group Date: 02/08/2024 Start Time: 2100 End Time: 2130 Facilitators: Joan Plowman B  Department: [facility]  Number of Participants: 4 Group Focus: abuse issues, activities of daily living skills, check in, communication, coping skills, individual meeting, relapse prevention, social skills, and substance abuse education Treatment Modality:  Individual Therapy, Leisure Development, and Spiritual Interventions utilized were leisure development, patient education, and support Purpose: enhance coping skills, express feelings, increase insight, relapse prevention strategies, and trigger / craving management  Name: Juan Hood Date of Birth: 07/24/1975  MR: [id number]    Level of Participation: PT DID NOT ATTEND GROUP Quality of Participation: cooperative Interactions with others: gave feedback Mood/Affect: appropriate Triggers (if applicable): NA Cognition: coherent/clear Progress: None Response: NA Plan: patient will be encouraged to go to groups  Patients Problems:  Patient Active Problem List   Diagnosis Date Noted   Polysubstance abuse (HCC) 02/05/2024   GAD (generalized anxiety disorder) 09/26/2022   Cocaine use disorder (HCC) 07/09/2022   Cocaine abuse (HCC) 12/08/2019   Alcohol abuse 12/08/2019   Tobacco abuse 12/08/2019   Acute hypoxic Respiratory failure (HCC) 12/08/2019

## 2024-02-08 NOTE — Group Note (Signed)
 Group Topic: Fears and Unhealthy Coping Skills  Group Date: 02/08/2024 Start Time: 0800 End Time: 0900 Facilitators: Lonzell Dwayne RAMAN, NT  Department: [facility]  Number of Participants: 8  Group Focus: chemical dependency education Treatment Modality:  Cognitive Behavioral Therapy Interventions utilized were patient education Purpose: reinforce self-care  Name: ABHINAV MAYORQUIN Date of Birth: [birth year]  MR: [id number]    Level of Participation: Patient did not attend group Quality of Participation: N/A Interactions with others: N/A Mood/Affect: N/A Triggers (if applicable):  Cognition: N/A Progress: N/A Response: N/A Plan: N/A  Patients Problems:  Patient Active Problem List   Diagnosis Date Noted   Polysubstance abuse (HCC) 02/05/2024   GAD (generalized anxiety disorder) 09/26/2022   Cocaine use disorder (HCC) 07/09/2022   Cocaine abuse (HCC) 12/08/2019   Alcohol abuse 12/08/2019   Tobacco abuse 12/08/2019   Acute hypoxic Respiratory failure (HCC) 12/08/2019

## 2024-02-08 NOTE — ED Notes (Signed)
 Pt is sleeping, no acute distress noted.

## 2024-02-09 DIAGNOSIS — F1490 Cocaine use, unspecified, uncomplicated: Secondary | ICD-10-CM | POA: Diagnosis not present

## 2024-02-09 DIAGNOSIS — F1190 Opioid use, unspecified, uncomplicated: Secondary | ICD-10-CM | POA: Diagnosis not present

## 2024-02-09 DIAGNOSIS — F1090 Alcohol use, unspecified, uncomplicated: Secondary | ICD-10-CM | POA: Diagnosis not present

## 2024-02-09 DIAGNOSIS — F1910 Other psychoactive substance abuse, uncomplicated: Secondary | ICD-10-CM | POA: Diagnosis not present

## 2024-02-09 NOTE — ED Notes (Signed)
 Patient is on CIWA but denied withdrawal symptoms. He reported sweating sometimes. No N/v reported. Denied having insomnia. He eats adequately. Denied SI/HI. Denied psychotic symptoms such audiovisual hallucination and delusion. Calm, quiet and cooperative No issue observed in this shift.

## 2024-02-09 NOTE — ED Notes (Signed)
 Patient resting with eyes closed in no apparent acute distress. Respirations even and unlabored. Environment secured. Safety checks in place according to facility policy.

## 2024-02-09 NOTE — ED Provider Notes (Signed)
 Behavioral Health Progress Note  Date and Time: 02/09/2024 1:56 PM Name: Juan Hood MRN:  [id number]  Subjective:  Juan Hood was seen in his room on rounds today. He denies withdrawal symptoms. He is sleeping fair and eating okay. No new physical complaints. He feels overall alright. He is still hoping for placement in residential rehab  Diagnosis:  Final diagnoses:  Polysubstance abuse (HCC)  Homelessness  Alcohol use disorder  Crack cocaine use  Opioid use disorder    Total Time spent with patient: 15 minutes  Past Psychiatric History: polysubstance use disorder (EtOH, cocaine), GAD Past Medical History: acute hypoxic respiratory failure (accidental overdose) Social History: homeless  Sleep: Fair  Appetite:  Good  Current Medications:  Current Facility-Administered Medications  Medication Dose Route Frequency Provider Last Rate Last Admin   alum & mag hydroxide-simeth (MAALOX/MYLANTA) 200-200-20 MG/5ML suspension 30 mL  30 mL Oral Q4H PRN Onuoha, Chinwendu V, NP       dicyclomine  (BENTYL ) tablet 20 mg  20 mg Oral Q6H PRN Onuoha, Chinwendu V, NP       haloperidol  (HALDOL ) tablet 5 mg  5 mg Oral TID PRN Onuoha, Chinwendu V, NP       And   diphenhydrAMINE  (BENADRYL ) capsule 50 mg  50 mg Oral TID PRN Onuoha, Chinwendu V, NP       haloperidol  lactate (HALDOL ) injection 5 mg  5 mg Intramuscular TID PRN Onuoha, Chinwendu V, NP       And   diphenhydrAMINE  (BENADRYL ) injection 50 mg  50 mg Intramuscular TID PRN Onuoha, Chinwendu V, NP       And   LORazepam  (ATIVAN ) injection 2 mg  2 mg Intramuscular TID PRN Onuoha, Chinwendu V, NP       haloperidol  lactate (HALDOL ) injection 10 mg  10 mg Intramuscular TID PRN Onuoha, Chinwendu V, NP       And   diphenhydrAMINE  (BENADRYL ) injection 50 mg  50 mg Intramuscular TID PRN Onuoha, Chinwendu V, NP       And   LORazepam  (ATIVAN ) injection 2 mg  2 mg Intramuscular TID PRN Onuoha, Chinwendu V, NP       hydrOXYzine  (ATARAX ) tablet 25 mg   25 mg Oral Q6H PRN Onuoha, Chinwendu V, NP   25 mg at 02/09/24 1351   loperamide  (IMODIUM ) capsule 2-4 mg  2-4 mg Oral PRN Onuoha, Chinwendu V, NP       magnesium  hydroxide (MILK OF MAGNESIA) suspension 30 mL  30 mL Oral Daily PRN Onuoha, Chinwendu V, NP       methocarbamol  (ROBAXIN ) tablet 500 mg  500 mg Oral Q8H PRN Onuoha, Chinwendu V, NP       multivitamin with minerals tablet 1 tablet  1 tablet Oral Daily Onuoha, Chinwendu V, NP   1 tablet at 02/09/24 9081   naproxen  (NAPROSYN ) tablet 500 mg  500 mg Oral BID PRN Onuoha, Chinwendu V, NP       nicotine  polacrilex (NICORETTE ) gum 2 mg  2 mg Oral PRN McCarty, Artie, MD       ondansetron  (ZOFRAN -ODT) disintegrating tablet 4 mg  4 mg Oral Q6H PRN Onuoha, Chinwendu V, NP       sertraline  (ZOLOFT ) tablet 50 mg  50 mg Oral Daily McCarty, Artie, MD   50 mg at 02/09/24 0918   traZODone  (DESYREL ) tablet 50 mg  50 mg Oral QHS PRN McCarty, Artie, MD   50 mg at 02/08/24 2145   No current outpatient medications on file.  Labs  Lab Results:  Admission on 02/05/2024  Component Date Value Ref Range Status   WBC 02/05/2024 5.3  4.0 - 10.5 K/uL Final   RBC 02/05/2024 4.97  4.22 - 5.81 MIL/uL Final   Hemoglobin 02/05/2024 16.8  13.0 - 17.0 g/dL Final   HCT 91/88/7974 48.5  39.0 - 52.0 % Final   MCV 02/05/2024 97.6  80.0 - 100.0 fL Final   MCH 02/05/2024 33.8  26.0 - 34.0 pg Final   MCHC 02/05/2024 34.6  30.0 - 36.0 g/dL Final   RDW 91/88/7974 12.2  11.5 - 15.5 % Final   Platelets 02/05/2024 263  150 - 400 K/uL Final   nRBC 02/05/2024 0.0  0.0 - 0.2 % Final   Neutrophils Relative % 02/05/2024 38  % Final   Neutro Abs 02/05/2024 2.0  1.7 - 7.7 K/uL Final   Lymphocytes Relative 02/05/2024 48  % Final   Lymphs Abs 02/05/2024 2.5  0.7 - 4.0 K/uL Final   Monocytes Relative 02/05/2024 9  % Final   Monocytes Absolute 02/05/2024 0.5  0.1 - 1.0 K/uL Final   Eosinophils Relative 02/05/2024 4  % Final   Eosinophils Absolute 02/05/2024 0.2  0.0 - 0.5 K/uL  Final   Basophils Relative 02/05/2024 1  % Final   Basophils Absolute 02/05/2024 0.1  0.0 - 0.1 K/uL Final   Immature Granulocytes 02/05/2024 0  % Final   Abs Immature Granulocytes 02/05/2024 0.02  0.00 - 0.07 K/uL Final   Performed at [facility], [address], [place], [place] [address]   Sodium 02/05/2024 138  135 - 145 mmol/L Final   Potassium 02/05/2024 5.0  3.5 - 5.1 mmol/L Final   Chloride 02/05/2024 103  98 - 111 mmol/L Final   CO2 02/05/2024 26  22 - 32 mmol/L Final   Glucose, Bld 02/05/2024 76  70 - 99 mg/dL Final   Glucose reference range applies only to samples taken after fasting for at least 8 hours.   BUN 02/05/2024 8  6 - 20 mg/dL Final   Creatinine, Ser 02/05/2024 0.98  0.61 - 1.24 mg/dL Final   Calcium 91/88/7974 9.1  8.9 - 10.3 mg/dL Final   Total Protein 91/88/7974 6.9  6.5 - 8.1 g/dL Final   Albumin 91/88/7974 3.4 (L)  3.5 - 5.0 g/dL Final   AST 91/88/7974 34  15 - 41 U/L Final   ALT 02/05/2024 41  0 - 44 U/L Final   Alkaline Phosphatase 02/05/2024 91  38 - 126 U/L Final   Total Bilirubin 02/05/2024 1.1  0.0 - 1.2 mg/dL Final   GFR, Estimated 02/05/2024 >60  >60 mL/min Final   Comment: (NOTE) Calculated using the CKD-EPI Creatinine Equation (2021)    Anion gap 02/05/2024 9  5 - 15 Final   Performed at [facility], [address], [place], [place] [address]   Hgb A1c MFr Bld 02/05/2024 5.4  4.8 - 5.6 % Final   Comment: (NOTE)         Prediabetes: 5.7 - 6.4         Diabetes: >6.4         Glycemic control for adults with diabetes: <7.0    Mean Plasma Glucose 02/05/2024 108  mg/dL Final   Comment: (NOTE) Performed At: [facility] [address] [place], [place] [id number] Jennette Shorter MD Ey:[id number]    Alcohol, Ethyl (B) 02/05/2024 <15  <15 mg/dL Final   Comment: (NOTE) For medical purposes only. Performed  at [facility], [address] [place], [place] [address]    Cholesterol 02/05/2024 206 (H)  0 - 200 mg/dL Final    Triglycerides 02/05/2024 133  <150 mg/dL Final   HDL 91/88/7974 90  >40 mg/dL Final   Total CHOL/HDL Ratio 02/05/2024 2.3  RATIO Final   VLDL 02/05/2024 27  0 - 40 mg/dL Final   LDL Cholesterol 02/05/2024 89  0 - 99 mg/dL Final   Comment:        Total Cholesterol/HDL:CHD Risk Coronary Heart Disease Risk Table                     Men   Women  1/2 Average Risk   3.4   3.3  Average Risk       5.0   4.4  2 X Average Risk   9.6   7.1  3 X Average Risk  23.4   11.0        Use the calculated Patient Ratio above and the CHD Risk Table to determine the patient's CHD Risk.        ATP III CLASSIFICATION (LDL):  <100     mg/dL   Optimal  899-870  mg/dL   Near or Above                    Optimal  130-159  mg/dL   Borderline  839-810  mg/dL   High  >809     mg/dL   Very High Performed at [facility], [address] [place], [place] [address]    TSH 02/05/2024 0.692  0.350 - 4.500 uIU/mL Final   Comment: Performed by a 3rd Generation assay with a functional sensitivity of <=0.01 uIU/mL. Performed at [facility], [address] [place], [place] [address]    POC Amphetamine UR 02/05/2024 None Detected  NONE DETECTED (Cut Off Level 1000 ng/mL) Final   POC Secobarbital (BAR) 02/05/2024 None Detected  NONE DETECTED (Cut Off Level 300 ng/mL) Final   POC Buprenorphine (BUP) 02/05/2024 None Detected  NONE DETECTED (Cut Off Level 10 ng/mL) Final   POC Oxazepam (BZO) 02/05/2024 None Detected  NONE DETECTED (Cut Off Level 300 ng/mL) Final   POC Cocaine UR 02/05/2024 Positive (A)  NONE DETECTED (Cut Off Level 300 ng/mL) Final   POC Methamphetamine UR 02/05/2024 Positive (A)  NONE DETECTED (Cut Off Level 1000 ng/mL) Final   POC Morphine 02/05/2024 None Detected  NONE DETECTED (Cut Off Level 300 ng/mL) Final   POC Methadone UR 02/05/2024 None Detected  NONE DETECTED (Cut Off Level 300 ng/mL) Final   POC Oxycodone UR 02/05/2024 Positive (A)  NONE DETECTED (Cut Off Level 100 ng/mL) Final    POC Marijuana UR 02/05/2024 Positive (A)  NONE DETECTED (Cut Off Level 50 ng/mL) Final    Blood Alcohol level:  Lab Results  Component Value Date   ETH <15 02/05/2024   ETH 94 (H) 07/09/2022    Metabolic Disorder Labs: Lab Results  Component Value Date   HGBA1C 5.4 02/05/2024   MPG 108 02/05/2024   No results found for: PROLACTIN Lab Results  Component Value Date   CHOL 206 (H) 02/05/2024   TRIG 133 02/05/2024   HDL 90 02/05/2024   CHOLHDL 2.3 02/05/2024   VLDL 27 02/05/2024   LDLCALC 89 02/05/2024   LDLCALC 106 (H) 07/09/2022    Therapeutic Lab Levels: No results found for: LITHIUM No results found for: VALPROATE No  results found for: CBMZ  Physical Findings   PHQ2-9    Flowsheet Row ED from 02/05/2024 in [facility] ED from 07/09/2022 in [facility]  PHQ-2 Total Score 4 2  PHQ-9 Total Score 22 8   Flowsheet Row ED from 02/05/2024 in [facility] Most recent reading at 02/05/2024 11:36 PM ED from 02/05/2024 in [facility] Most recent reading at 02/05/2024 10:17 PM ED from 07/09/2022 in [facility] Most recent reading at 07/13/2022  9:52 AM  C-SSRS RISK CATEGORY No Risk No Risk No Risk     Musculoskeletal  Strength & Muscle Tone: within normal limits Gait & Station: normal Patient leans: N/A  Psychiatric Specialty Exam  Presentation  General Appearance:  Casual  Eye Contact: Fair  Speech: Normal Rate  Speech Volume: Normal  Handedness: Right   Mood and Affect  Mood: Euthymic  Affect: Blunt   Thought Process  Thought Processes: Linear  Descriptions of Associations:Intact  Orientation:Full (Time, Place and Person)  Thought Content:Logical  Diagnosis of Schizophrenia or Schizoaffective disorder in past: No data recorded   Hallucinations:Hallucinations: None  Ideas of  Reference:None  Suicidal Thoughts:Suicidal Thoughts: No  Homicidal Thoughts:Homicidal Thoughts: No   Sensorium  Memory: Immediate Fair; Recent Fair  Judgment: Fair  Insight: Fair   Art therapist  Concentration: Fair  Attention Span: Fair  Recall: Fair  Fund of Knowledge: Good  Language: Good   Psychomotor Activity  Psychomotor Activity: Psychomotor Activity: Normal   Assets  Assets: Communication Skills; Desire for Improvement   Sleep  Sleep: Sleep: Good  Estimated Sleeping Duration (Last 24 Hours): 13.50-15.25 hours  No data recorded  Physical Exam  Physical Exam Vitals and nursing note reviewed.  Constitutional:      Appearance: Normal appearance.  HENT:     Head: Normocephalic and atraumatic.  Eyes:     Extraocular Movements: Extraocular movements intact.  Pulmonary:     Effort: Pulmonary effort is normal.  Musculoskeletal:        General: Normal range of motion.     Cervical back: Normal range of motion.  Neurological:     General: No focal deficit present.     Mental Status: He is alert and oriented to person, place, and time.  Psychiatric:        Behavior: Behavior normal.    Review of Systems  Constitutional:  Negative for fever.  Cardiovascular:  Negative for chest pain.  Gastrointestinal:  Negative for constipation, diarrhea, nausea and vomiting.  Genitourinary:  Negative for dysuria.  Musculoskeletal:  Negative for joint pain and myalgias.  Skin:  Negative for rash.  Psychiatric/Behavioral:  Negative for hallucinations and suicidal ideas.    Blood pressure 102/60, pulse (!) 59, temperature 97.9 F (36.6 C), temperature source Oral, resp. rate 19, SpO2 96%. There is no height or weight on file to calculate BMI.  Treatment Plan Summary: Short Term Goals: Patient will verbalize feelings in meetings with treatment team members., Patient will attend at least of 50% of the groups daily., Pt will complete the PHQ9 on  admission, day 3 and discharge., Patient will participate in completing the [place] Suicide Severity Rating Scale, Patient will score a low risk of violence for 24 hours prior to discharge, and Patient will take medications as prescribed daily. Long Term Goals: Improvement in symptoms so as ready for discharge Monitor response to sertraline . Refer patient for residential substance treatment.   Transfer to  residential when treatment facility located and accepted Continue CIWA protocol with Librium  PRN Continue COWS protocol Continue Zoloft  50 mg once daily for MDD Continue nicotine  gum Monitor right eye acuity for improvement  Corean Anette Potters, MD 02/09/2024 1:56 PM

## 2024-02-09 NOTE — ED Notes (Signed)
 Patient sitting in dayroom interacting with peers. No acute distress noted. No concerns voiced. No inappropriate behaviors observed or reported at this time. Informed patient to notify staff with any needs or assistance. Patient verbalized understanding or agreement. Safety checks in place per facility policy.

## 2024-02-09 NOTE — Group Note (Signed)
 Group Topic: Understanding Self  Group Date: 02/09/2024 Start Time: 1945 End Time: 2005 Facilitators: Nathanael Moulder, NT; Anice Benton LABOR, NT  Department: [facility]  Number of Participants: 7  Group Focus: acceptance and coping skills Treatment Modality:  Patient-Centered Therapy Interventions utilized were group exercise Purpose: explore maladaptive thinking  Name: Juan Hood Date of Birth: 07-20-1975  MR: [id number]    Level of Participation: active Quality of Participation: cooperative Interactions with others: gave feedback Mood/Affect: appropriate Triggers (if applicable): NA Cognition: insightful Progress: Gaining insight Response: Good Plan: follow-up needed  Patients Problems:  Patient Active Problem List   Diagnosis Date Noted   Polysubstance abuse (HCC) 02/05/2024   GAD (generalized anxiety disorder) 09/26/2022   Cocaine use disorder (HCC) 07/09/2022   Cocaine abuse (HCC) 12/08/2019   Alcohol abuse 12/08/2019   Tobacco abuse 12/08/2019   Acute hypoxic Respiratory failure (HCC) 12/08/2019

## 2024-02-09 NOTE — Discharge Planning (Signed)
 SW received call back from [place] [facility] with approval for patient to admit to residential program on Tuesday 8/19. He will be provided with a 14 day supply of meds and 30 day scripts and will need cab transport arranged by 8:15 for arrival to [facility] at 9:00. Address is [address] [place], [place] [address]. Number to call for emergency is [phone]

## 2024-02-09 NOTE — Group Note (Signed)
 Group Topic: Social Support  Group Date: 02/09/2024 Start Time: 1230 End Time: 1300 Facilitators: Peytan Andringa, Zane HERO, RN; Morton, Comfort C, RN  Department: [facility]  Number of Participants: 1  Group Focus: check in Treatment Modality:  Individual Therapy Interventions utilized were support Purpose: express feelings  Name: Juan Hood Date of Birth: January 17, 1976  MR: [id number]    Level of Participation: moderate Quality of Participation: attentive and cooperative Interactions with others: gave feedback Mood/Affect: appropriate Triggers (if applicable): None identified at this time Cognition: coherent/clear and logical Progress: Gaining insight Response: Patient voices no concerns at this time. Patient assessed in milieu. No inappropriate behaviors noted, patient verbalized gratitude for the reading material which he has obtained from the unit. Plan: patient will be encouraged to continue to attend groups/programming on the unit  Patients Problems:  Patient Active Problem List   Diagnosis Date Noted   Polysubstance abuse (HCC) 02/05/2024   GAD (generalized anxiety disorder) 09/26/2022   Cocaine use disorder (HCC) 07/09/2022   Cocaine abuse (HCC) 12/08/2019   Alcohol abuse 12/08/2019   Tobacco abuse 12/08/2019   Acute hypoxic Respiratory failure (HCC) 12/08/2019

## 2024-02-09 NOTE — [facility] Group Notes (Signed)
 SPIRITUALITY GROUP NOTE  Spirituality group facilitated by Chaplain Adalina Dopson, MDiv, BCC.  Group Description:  Group focused on topic of hope.  Patients participated in facilitated discussion around topic, connecting with one another around experiences and definitions for hope.  Group members engaged with visual explorer photos, reflecting on what hope looks like for them today.  Group engaged in discussion around how their definitions of hope are present today in hospital.   Modalities: Psycho-social ed, Adlerian, Narrative, MI Patient Progress: Juan Hood was present throughout group.  Participated in group discussion when engaged by facilitator

## 2024-02-09 NOTE — ED Notes (Signed)
 Patient is in the bedroom composed and sleeping. NAD. Will monitor for safety.

## 2024-02-09 NOTE — ED Notes (Signed)

## 2024-02-09 NOTE — ED Notes (Signed)
 PRN atarax  given due to patient reports of anxiety. Medication administered with no complications. Environment secured, safety checks in place per facility policy.

## 2024-02-10 DIAGNOSIS — F1090 Alcohol use, unspecified, uncomplicated: Secondary | ICD-10-CM | POA: Diagnosis not present

## 2024-02-10 DIAGNOSIS — F1190 Opioid use, unspecified, uncomplicated: Secondary | ICD-10-CM | POA: Diagnosis not present

## 2024-02-10 DIAGNOSIS — F1910 Other psychoactive substance abuse, uncomplicated: Secondary | ICD-10-CM | POA: Diagnosis not present

## 2024-02-10 DIAGNOSIS — F1490 Cocaine use, unspecified, uncomplicated: Secondary | ICD-10-CM | POA: Diagnosis not present

## 2024-02-10 MED ORDER — TRAZODONE HCL 100 MG PO TABS
100.0000 mg | ORAL_TABLET | Freq: Every evening | ORAL | Status: DC | PRN
  Administered 2024-02-10 – 2024-02-12 (×3): 100 mg via ORAL
  Filled 2024-02-10 (×2): qty 1
  Filled 2024-02-10: qty 14
  Filled 2024-02-10: qty 1

## 2024-02-10 NOTE — Group Note (Signed)
 Group Topic: Overcoming Obstacles  Group Date: 02/10/2024 Start Time: 1000 End Time: 1100 Facilitators: Alyse Leilani LABOR, NT  Department: [facility]  Number of Participants: 9  Group Focus: acceptance, chemical dependency education, coping skills, and daily focus Treatment Modality:  Individual Therapy Interventions utilized were group exercise Purpose: enhance coping skills and express irrational fears  Name: Juan Hood Date of Birth: [birth year]  MR: [id number]   Pt did not attend group. Patients Problems:  Patient Active Problem List   Diagnosis Date Noted   Polysubstance abuse (HCC) 02/05/2024   GAD (generalized anxiety disorder) 09/26/2022   Cocaine use disorder (HCC) 07/09/2022   Cocaine abuse (HCC) 12/08/2019   Alcohol abuse 12/08/2019   Tobacco abuse 12/08/2019   Acute hypoxic Respiratory failure (HCC) 12/08/2019

## 2024-02-10 NOTE — ED Notes (Signed)
 Environment is quiet and the patient is sleeping

## 2024-02-10 NOTE — ED Notes (Signed)
 Pt sitting in dayroom watching television and interacting with peers. No acute distress noted. No concerns voiced. Informed pt to notify staff with any needs or assistance. Pt verbalized understanding and agreement. Will continue to monitor for safety.

## 2024-02-10 NOTE — ED Notes (Signed)
Patient A&Ox4. Denies intent to harm self/others when asked. Denies A/VH. Patient denies any physical complaints when asked. No acute distress noted. Routine safety checks conducted according to facility protocol. Encouraged patient to notify staff if thoughts of harm toward self or others arise. Patient verbalize understanding and agreement. Will continue to monitor for safety.    

## 2024-02-10 NOTE — Group Note (Signed)
 Group Topic: Relapse and Recovery  Group Date: 02/10/2024 Start Time: 2000 End Time: 2100 Facilitators: Joan Plowman B  Department: [facility]  Number of Participants: 7  Group Focus: abuse issues, chemical dependency education, community group, and dual diagnosis Treatment Modality:  Leisure Development and Spiritual Interventions utilized were leisure development and patient education Purpose: enhance coping skills, increase insight, relapse prevention strategies, and trigger / craving management  Name: Juan Hood Date of Birth: July 13, 1975  MR: [id number]    Level of Participation: active Quality of Participation: attentive and cooperative Interactions with others: gave feedback Mood/Affect: appropriate Triggers (if applicable): NA Cognition: coherent/clear Progress: Gaining insight Response: NA Plan: patient will be encouraged to keep going to groups  Patients Problems:  Patient Active Problem List   Diagnosis Date Noted   Polysubstance abuse (HCC) 02/05/2024   GAD (generalized anxiety disorder) 09/26/2022   Cocaine use disorder (HCC) 07/09/2022   Cocaine abuse (HCC) 12/08/2019   Alcohol abuse 12/08/2019   Tobacco abuse 12/08/2019   Acute hypoxic Respiratory failure (HCC) 12/08/2019

## 2024-02-10 NOTE — ED Provider Notes (Signed)
 Behavioral Health Progress Note  Date and Time: 02/10/2024 2:11 PM Name: Juan Hood MRN:  [id number]  HPI: Juan Hood is a 48 year old male with psychiatric history of polysubstance abuse-cocaine, alcohol, tobacco, GAD, Homelessness, and 1 Accidental Overdose (6/21),who presented voluntarily as a walk in to [facility] seeking detox/substance use treatment from crack cocaine and alcohol.   Patient assessment, 02/10/2024: Pt presents today with a depressed mood, his attention to personal hygiene and grooming is fair, eye contact is good, speech is clear & coherent. Thought contents are organized and logical, and pt currently denies SI/HI/AVH or paranoia. There is no evidence of delusional thoughts. There are no overt signs of psychosis, and pt does not seem to be responding to any internal/external stimuli.   He shares that sleep last night was fair, states that he would like an increase in the dose of his trazodone  from 50 to 100 mg nightly to help him have more restful sleep. He reports a good appetite, denies being in any physical distress, rates depression today as 5, with 10 being worst, rates anxiety same. Pt prefers however, for Zoloft  to stay at 50 mg daily for depressive symptoms, and we are increasing the Trazodone  as per his request to 100 mg nightly as needed for sleep. Continuing other medications as listed on the [facility].  Patient shares that he has been accepted for admission into [facility] on Tuesday, 02/13/24, and as per note from CSW dated yesterday, this is accurate. We will therefore keep patient at the [facility] until that date to mitigate his risks of relapsing after discharge. So, tentative discharge date is 02/13/24.   Diagnosis:  Final diagnoses:  Polysubstance abuse (HCC)  Homelessness  Alcohol use disorder  Crack cocaine use  Opioid use disorder    Total Time spent with patient: 30 minutes  Past Psychiatric History: see H & P   Sleep: Fair  Appetite:   Good  Current Medications:  Current Facility-Administered Medications  Medication Dose Route Frequency Provider Last Rate Last Admin   alum & mag hydroxide-simeth (MAALOX/MYLANTA) 200-200-20 MG/5ML suspension 30 mL  30 mL Oral Q4H PRN Onuoha, Chinwendu V, NP       dicyclomine  (BENTYL ) tablet 20 mg  20 mg Oral Q6H PRN Onuoha, Chinwendu V, NP       haloperidol  (HALDOL ) tablet 5 mg  5 mg Oral TID PRN Onuoha, Chinwendu V, NP       And   diphenhydrAMINE  (BENADRYL ) capsule 50 mg  50 mg Oral TID PRN Onuoha, Chinwendu V, NP       haloperidol  lactate (HALDOL ) injection 5 mg  5 mg Intramuscular TID PRN Onuoha, Chinwendu V, NP       And   diphenhydrAMINE  (BENADRYL ) injection 50 mg  50 mg Intramuscular TID PRN Onuoha, Chinwendu V, NP       And   LORazepam  (ATIVAN ) injection 2 mg  2 mg Intramuscular TID PRN Onuoha, Chinwendu V, NP       haloperidol  lactate (HALDOL ) injection 10 mg  10 mg Intramuscular TID PRN Onuoha, Chinwendu V, NP       And   diphenhydrAMINE  (BENADRYL ) injection 50 mg  50 mg Intramuscular TID PRN Onuoha, Chinwendu V, NP       And   LORazepam  (ATIVAN ) injection 2 mg  2 mg Intramuscular TID PRN Onuoha, Chinwendu V, NP       hydrOXYzine  (ATARAX ) tablet 25 mg  25 mg Oral Q6H PRN Onuoha, Chinwendu V, NP   25 mg  at 02/09/24 1351   loperamide  (IMODIUM ) capsule 2-4 mg  2-4 mg Oral PRN Onuoha, Chinwendu V, NP       magnesium  hydroxide (MILK OF MAGNESIA) suspension 30 mL  30 mL Oral Daily PRN Onuoha, Chinwendu V, NP       methocarbamol  (ROBAXIN ) tablet 500 mg  500 mg Oral Q8H PRN Onuoha, Chinwendu V, NP       multivitamin with minerals tablet 1 tablet  1 tablet Oral Daily Onuoha, Chinwendu V, NP   1 tablet at 02/10/24 1005   naproxen  (NAPROSYN ) tablet 500 mg  500 mg Oral BID PRN Onuoha, Chinwendu V, NP       nicotine  polacrilex (NICORETTE ) gum 2 mg  2 mg Oral PRN McCarty, Artie, MD       ondansetron  (ZOFRAN -ODT) disintegrating tablet 4 mg  4 mg Oral Q6H PRN Onuoha, Chinwendu V, NP        sertraline  (ZOLOFT ) tablet 50 mg  50 mg Oral Daily McCarty, Artie, MD   50 mg at 02/10/24 1005   traZODone  (DESYREL ) tablet 100 mg  100 mg Oral QHS PRN Tex Drilling, NP       No current outpatient medications on file.    Labs  Lab Results:  Admission on 02/05/2024  Component Date Value Ref Range Status   WBC 02/05/2024 5.3  4.0 - 10.5 K/uL Final   RBC 02/05/2024 4.97  4.22 - 5.81 MIL/uL Final   Hemoglobin 02/05/2024 16.8  13.0 - 17.0 g/dL Final   HCT 91/88/7974 48.5  39.0 - 52.0 % Final   MCV 02/05/2024 97.6  80.0 - 100.0 fL Final   MCH 02/05/2024 33.8  26.0 - 34.0 pg Final   MCHC 02/05/2024 34.6  30.0 - 36.0 g/dL Final   RDW 91/88/7974 12.2  11.5 - 15.5 % Final   Platelets 02/05/2024 263  150 - 400 K/uL Final   nRBC 02/05/2024 0.0  0.0 - 0.2 % Final   Neutrophils Relative % 02/05/2024 38  % Final   Neutro Abs 02/05/2024 2.0  1.7 - 7.7 K/uL Final   Lymphocytes Relative 02/05/2024 48  % Final   Lymphs Abs 02/05/2024 2.5  0.7 - 4.0 K/uL Final   Monocytes Relative 02/05/2024 9  % Final   Monocytes Absolute 02/05/2024 0.5  0.1 - 1.0 K/uL Final   Eosinophils Relative 02/05/2024 4  % Final   Eosinophils Absolute 02/05/2024 0.2  0.0 - 0.5 K/uL Final   Basophils Relative 02/05/2024 1  % Final   Basophils Absolute 02/05/2024 0.1  0.0 - 0.1 K/uL Final   Immature Granulocytes 02/05/2024 0  % Final   Abs Immature Granulocytes 02/05/2024 0.02  0.00 - 0.07 K/uL Final   Performed at [facility], [address], [place], [place] [address]   Sodium 02/05/2024 138  135 - 145 mmol/L Final   Potassium 02/05/2024 5.0  3.5 - 5.1 mmol/L Final   Chloride 02/05/2024 103  98 - 111 mmol/L Final   CO2 02/05/2024 26  22 - 32 mmol/L Final   Glucose, Bld 02/05/2024 76  70 - 99 mg/dL Final   Glucose reference range applies only to samples taken after fasting for at least 8 hours.   BUN 02/05/2024 8  6 - 20 mg/dL Final   Creatinine, Ser 02/05/2024 0.98  0.61 - 1.24 mg/dL Final   Calcium 91/88/7974  9.1  8.9 - 10.3 mg/dL Final   Total Protein 91/88/7974 6.9  6.5 - 8.1 g/dL Final   Albumin  02/05/2024 3.4 (L)  3.5 - 5.0 g/dL Final   AST 91/88/7974 34  15 - 41 U/L Final   ALT 02/05/2024 41  0 - 44 U/L Final   Alkaline Phosphatase 02/05/2024 91  38 - 126 U/L Final   Total Bilirubin 02/05/2024 1.1  0.0 - 1.2 mg/dL Final   GFR, Estimated 02/05/2024 >60  >60 mL/min Final   Comment: (NOTE) Calculated using the CKD-EPI Creatinine Equation (2021)    Anion gap 02/05/2024 9  5 - 15 Final   Performed at [facility], [address] [place], [place] [address]   Hgb A1c MFr Bld 02/05/2024 5.4  4.8 - 5.6 % Final   Comment: (NOTE)         Prediabetes: 5.7 - 6.4         Diabetes: >6.4         Glycemic control for adults with diabetes: <7.0    Mean Plasma Glucose 02/05/2024 108  mg/dL Final   Comment: (NOTE) Performed At: [facility] [address] [place], [place] [id number] Jennette Shorter MD Ey:[id number]    Alcohol, Ethyl (B) 02/05/2024 <15  <15 mg/dL Final   Comment: (NOTE) For medical purposes only. Performed at [facility], [address], [place], [place] [address]    Cholesterol 02/05/2024 206 (H)  0 - 200 mg/dL Final   Triglycerides 91/88/7974 133  <150 mg/dL Final   HDL 91/88/7974 90  >40 mg/dL Final   Total CHOL/HDL Ratio 02/05/2024 2.3  RATIO Final   VLDL 02/05/2024 27  0 - 40 mg/dL Final   LDL Cholesterol 02/05/2024 89  0 - 99 mg/dL Final   Comment:        Total Cholesterol/HDL:CHD Risk Coronary Heart Disease Risk Table                     Men   Women  1/2 Average Risk   3.4   3.3  Average Risk       5.0   4.4  2 X Average Risk   9.6   7.1  3 X Average Risk  23.4   11.0        Use the calculated Patient Ratio above and the CHD Risk Table to determine the patient's CHD Risk.        ATP III CLASSIFICATION (LDL):  <100     mg/dL   Optimal  899-870  mg/dL   Near or Above                    Optimal  130-159  mg/dL   Borderline  839-810  mg/dL    High  >809     mg/dL   Very High Performed at [facility], [address] [place], [place] [address]    TSH 02/05/2024 0.692  0.350 - 4.500 uIU/mL Final   Comment: Performed by a 3rd Generation assay with a functional sensitivity of <=0.01 uIU/mL. Performed at [facility], [address] [place], [place] [address]    POC Amphetamine UR 02/05/2024 None Detected  NONE DETECTED (Cut Off Level 1000 ng/mL) Final   POC Secobarbital (BAR) 02/05/2024 None Detected  NONE DETECTED (Cut Off Level 300 ng/mL) Final   POC Buprenorphine (BUP) 02/05/2024 None Detected  NONE DETECTED (Cut Off Level 10 ng/mL) Final   POC Oxazepam (BZO) 02/05/2024 None Detected  NONE DETECTED (Cut Off Level 300 ng/mL) Final   POC Cocaine UR 02/05/2024  Positive (A)  NONE DETECTED (Cut Off Level 300 ng/mL) Final   POC Methamphetamine UR 02/05/2024 Positive (A)  NONE DETECTED (Cut Off Level 1000 ng/mL) Final   POC Morphine 02/05/2024 None Detected  NONE DETECTED (Cut Off Level 300 ng/mL) Final   POC Methadone UR 02/05/2024 None Detected  NONE DETECTED (Cut Off Level 300 ng/mL) Final   POC Oxycodone UR 02/05/2024 Positive (A)  NONE DETECTED (Cut Off Level 100 ng/mL) Final   POC Marijuana UR 02/05/2024 Positive (A)  NONE DETECTED (Cut Off Level 50 ng/mL) Final    Blood Alcohol level:  Lab Results  Component Value Date   ETH <15 02/05/2024   ETH 94 (H) 07/09/2022    Metabolic Disorder Labs: Lab Results  Component Value Date   HGBA1C 5.4 02/05/2024   MPG 108 02/05/2024   No results found for: PROLACTIN Lab Results  Component Value Date   CHOL 206 (H) 02/05/2024   TRIG 133 02/05/2024   HDL 90 02/05/2024   CHOLHDL 2.3 02/05/2024   VLDL 27 02/05/2024   LDLCALC 89 02/05/2024   LDLCALC 106 (H) 07/09/2022    Therapeutic Lab Levels: No results found for: LITHIUM No results found for: VALPROATE No results found for: CBMZ  Physical Findings   PHQ2-9    Flowsheet Row ED from 02/05/2024 in  [facility] ED from 07/09/2022 in [facility]  PHQ-2 Total Score 4 2  PHQ-9 Total Score 22 8   Flowsheet Row ED from 02/05/2024 in [facility] Most recent reading at 02/05/2024 11:36 PM ED from 02/05/2024 in [facility] Most recent reading at 02/05/2024 10:17 PM ED from 07/09/2022 in [facility] Most recent reading at 07/13/2022  9:52 AM  C-SSRS RISK CATEGORY No Risk No Risk No Risk     Musculoskeletal  Strength & Muscle Tone: within normal limits Gait & Station: normal Patient leans: N/A  Psychiatric Specialty Exam  Presentation  General Appearance:  Fairly Groomed  Eye Contact: Fair  Speech: Clear and Coherent  Speech Volume: Normal  Handedness: Right   Mood and Affect  Mood: Depressed; Anxious  Affect: Congruent   Thought Process  Thought Processes: Coherent  Descriptions of Associations:Intact  Orientation:Full (Time, Place and Person)  Thought Content:Logical  Diagnosis of Schizophrenia or Schizoaffective disorder in past: No data recorded   Hallucinations:Hallucinations: None  Ideas of Reference:None  Suicidal Thoughts:Suicidal Thoughts: No  Homicidal Thoughts:Homicidal Thoughts: No   Sensorium  Memory: Immediate Fair  Judgment: Fair  Insight: Fair   Art therapist  Concentration: Fair  Attention Span: Fair  Recall: [employer] of Knowledge: Fair  Language: Fair   Psychomotor Activity  Psychomotor Activity:Psychomotor Activity: Normal   Assets  Assets: Resilience  Sleep  Sleep:Sleep: Fair  Estimated Sleeping Duration (Last 24 Hours): 13.75 hours  No data recorded  Physical Exam  Physical Exam Constitutional:      Appearance: Normal appearance.  Musculoskeletal:        General: Normal range of motion.     Cervical back: Normal range of motion.  Neurological:      Mental Status: He is alert and oriented to person, place, and time.    Review of Systems  Psychiatric/Behavioral:  Positive for depression and substance abuse. Negative for hallucinations, memory loss and suicidal ideas. The patient is nervous/anxious and has insomnia.   All other systems reviewed and are negative.  Blood pressure 115/84, pulse 71, temperature  97.8 F (36.6 C), temperature source Oral, resp. rate 18, SpO2 100%. There is no height or weight on file to calculate BMI.  Treatment Plan Summary: Daily contact with patient to assess and evaluate symptoms and progress in treatment and Medication management  Medication adjustments for today: Increase Trazodone  from 50 mg to 100 mg nightly as needed for sleep.  Please see the [facility] for complete list of medications. Plan: Discharge on 02/13/2024 to [facility].  Donia Snell, NP 02/10/2024 2:11 PM

## 2024-02-10 NOTE — ED Notes (Signed)
 No sleep disturbances noted the patient is sleeping. Quiet environment.

## 2024-02-11 DIAGNOSIS — F1190 Opioid use, unspecified, uncomplicated: Secondary | ICD-10-CM | POA: Diagnosis not present

## 2024-02-11 DIAGNOSIS — F1910 Other psychoactive substance abuse, uncomplicated: Secondary | ICD-10-CM | POA: Diagnosis not present

## 2024-02-11 DIAGNOSIS — F1490 Cocaine use, unspecified, uncomplicated: Secondary | ICD-10-CM | POA: Diagnosis not present

## 2024-02-11 DIAGNOSIS — F1090 Alcohol use, unspecified, uncomplicated: Secondary | ICD-10-CM | POA: Diagnosis not present

## 2024-02-11 NOTE — ED Notes (Signed)
 Currently patient is doing well. Denied SI/HI and audiovisual hallucination. Denied withdrawal symptoms. Denied depression and anxiety. No sleep disturbances reported. He eats adequately. BM remains as usual Med compliant.

## 2024-02-11 NOTE — ED Notes (Signed)
 Patient A&Ox4. Denies intent to harm self/others when asked. Denies A/VH. Patient denies any physical complaints when asked. No acute distress noted. Denies withdrawal sx at present. Pt reports sleeping good last night. Support and encouragement provided. Routine safety checks conducted according to facility protocol. Encouraged patient to notify staff if thoughts of harm toward self or others arise. Patient verbalize understanding and agreement. Will continue to monitor for safety.

## 2024-02-11 NOTE — Group Note (Signed)
 Group Topic: Communication  Group Date: 02/10/2024 Start Time: 0900 End Time: 1020 Facilitators: Herold Lajuana NOVAK, RN  Department: [facility]  Number of Participants: 9  Group Focus: leisure skills Treatment Modality:  Individual Therapy Interventions utilized were patient education Purpose: increase insight  Name: Juan Hood Date of Birth: [birth year]  MR: [id number]      Patients Problems:  Level of Participation: active Quality of Participation: cooperative Interactions with others: gave feedback Mood/Affect: appropriate Triggers (if applicable): none identified Cognition: coherent/clear Progress: Gaining insight Response: pt verbalized understanding of medicatons given  Plan: patient will be encouraged to continue med and tx compliance in order to have safe detox

## 2024-02-11 NOTE — ED Notes (Signed)
 Pt has been calm, pleasant and cooperative, interacted appropriately with peers and staff this evening. Pt denied SI/HI/AVH, said he had no complaints. No withdrawal symptoms noted nor reported by pt. Pt attended group. Staff will continue to monitor pt for safety and implement current plan of care.

## 2024-02-11 NOTE — Group Note (Signed)
 Group Topic: Wellness  Group Date: 02/11/2024 Start Time: 1930 End Time: 2000 Facilitators: Verdon Jacqualyn BRAVO, NT  Department: [facility]  Number of Participants: 8  Group Focus: daily focus Treatment Modality:  Individual Therapy Interventions utilized were group exercise Purpose: reinforce self-care  Name: CORDERIUS SARACENI Date of Birth: 1975-11-25  MR: [id number]    Level of Participation: active Quality of Participation: cooperative Interactions with others: gave feedback Mood/Affect: appropriate Triggers (if applicable): n/a Cognition: coherent/clear Progress: Moderate Response: self care- more structure, confidence, what will make me happy- living life without drugs and alcohol, my must do's- go to treatment, go to classes, keep having [employer] in [employer], I can fit in self care this week- at [facility] Plan: follow-up needed  Patients Problems:  Patient Active Problem List   Diagnosis Date Noted   Polysubstance abuse (HCC) 02/05/2024   GAD (generalized anxiety disorder) 09/26/2022   Cocaine use disorder (HCC) 07/09/2022   Cocaine abuse (HCC) 12/08/2019   Alcohol abuse 12/08/2019   Tobacco abuse 12/08/2019   Acute hypoxic Respiratory failure (HCC) 12/08/2019

## 2024-02-11 NOTE — ED Notes (Signed)
 Pt sitting in dayroom watching television and interacting with peers. No acute distress noted. No concerns voiced. Informed pt to notify staff with any needs or assistance. Pt verbalized understanding and agreement. Will continue to monitor for safety.

## 2024-02-11 NOTE — Group Note (Signed)
 Group Topic: Relaxation  Group Date: 02/11/2024 Start Time: 1315 End Time: 1400 Facilitators: Laneta Renea POUR, NT  Department: [facility]  Number of Participants: 3  Group Focus: coping skills Treatment Modality:  Psychoeducation Interventions utilized were leisure development Purpose: enhance coping skills and increase insight  Name: Juan Hood Date of Birth: 01/12/1976  MR: [id number]    Level of Participation: Did not attend group Quality of Participation: N/A Interactions with others: N/A Mood/Affect: N/A Triggers (if applicable): N/A Cognition: N/A Progress: None Response: N/A Plan: patient will be encouraged to attend group  Patients Problems:  Patient Active Problem List   Diagnosis Date Noted   Polysubstance abuse (HCC) 02/05/2024   GAD (generalized anxiety disorder) 09/26/2022   Cocaine use disorder (HCC) 07/09/2022   Cocaine abuse (HCC) 12/08/2019   Alcohol abuse 12/08/2019   Tobacco abuse 12/08/2019   Acute hypoxic Respiratory failure (HCC) 12/08/2019

## 2024-02-11 NOTE — ED Notes (Addendum)
 Pt sleeping in no acute distress. RR even and unlabored. Environment secured. Will continue to monitor for safety.

## 2024-02-11 NOTE — ED Provider Notes (Signed)
 Behavioral Health Progress Note  Date and Time: 02/11/2024 12:47 PM Name: DEDRICK HEFFNER MRN:  [id number]  Subjective:  Dearion was seen in the common area on rounds today. He is sleeping and eating okay. He went to the AA group last evening. No withdrawal or cravings today. No new physical complaints.   Diagnosis:  Final diagnoses:  Polysubstance abuse (HCC)  Homelessness  Alcohol use disorder  Crack cocaine use  Opioid use disorder    Total Time spent with patient: 15 minutes  Past Psychiatric History: polysubstance use disorder (EtOH, cocaine), GAD Past Medical History: acute hypoxic respiratory failure (accidental overdose) Social History: homeless  Sleep: Good  Appetite:  Good  Current Medications:  Current Facility-Administered Medications  Medication Dose Route Frequency Provider Last Rate Last Admin   alum & mag hydroxide-simeth (MAALOX/MYLANTA) 200-200-20 MG/5ML suspension 30 mL  30 mL Oral Q4H PRN Onuoha, Chinwendu V, NP       haloperidol  (HALDOL ) tablet 5 mg  5 mg Oral TID PRN Onuoha, Chinwendu V, NP       And   diphenhydrAMINE  (BENADRYL ) capsule 50 mg  50 mg Oral TID PRN Onuoha, Chinwendu V, NP       haloperidol  lactate (HALDOL ) injection 5 mg  5 mg Intramuscular TID PRN Onuoha, Chinwendu V, NP       And   diphenhydrAMINE  (BENADRYL ) injection 50 mg  50 mg Intramuscular TID PRN Onuoha, Chinwendu V, NP       And   LORazepam  (ATIVAN ) injection 2 mg  2 mg Intramuscular TID PRN Onuoha, Chinwendu V, NP       haloperidol  lactate (HALDOL ) injection 10 mg  10 mg Intramuscular TID PRN Onuoha, Chinwendu V, NP       And   diphenhydrAMINE  (BENADRYL ) injection 50 mg  50 mg Intramuscular TID PRN Onuoha, Chinwendu V, NP       And   LORazepam  (ATIVAN ) injection 2 mg  2 mg Intramuscular TID PRN Onuoha, Chinwendu V, NP       magnesium  hydroxide (MILK OF MAGNESIA) suspension 30 mL  30 mL Oral Daily PRN Onuoha, Chinwendu V, NP       multivitamin with minerals tablet 1 tablet  1  tablet Oral Daily Onuoha, Chinwendu V, NP   1 tablet at 02/11/24 0840   nicotine  polacrilex (NICORETTE ) gum 2 mg  2 mg Oral PRN McCarty, Artie, MD       sertraline  (ZOLOFT ) tablet 50 mg  50 mg Oral Daily McCarty, Artie, MD   50 mg at 02/11/24 0840   traZODone  (DESYREL ) tablet 100 mg  100 mg Oral QHS PRN Tex Drilling, NP   100 mg at 02/10/24 2202   No current outpatient medications on file.    Labs  Lab Results:  Admission on 02/05/2024  Component Date Value Ref Range Status   WBC 02/05/2024 5.3  4.0 - 10.5 K/uL Final   RBC 02/05/2024 4.97  4.22 - 5.81 MIL/uL Final   Hemoglobin 02/05/2024 16.8  13.0 - 17.0 g/dL Final   HCT 91/88/7974 48.5  39.0 - 52.0 % Final   MCV 02/05/2024 97.6  80.0 - 100.0 fL Final   MCH 02/05/2024 33.8  26.0 - 34.0 pg Final   MCHC 02/05/2024 34.6  30.0 - 36.0 g/dL Final   RDW 91/88/7974 12.2  11.5 - 15.5 % Final   Platelets 02/05/2024 263  150 - 400 K/uL Final   nRBC 02/05/2024 0.0  0.0 - 0.2 % Final   Neutrophils Relative %  02/05/2024 38  % Final   Neutro Abs 02/05/2024 2.0  1.7 - 7.7 K/uL Final   Lymphocytes Relative 02/05/2024 48  % Final   Lymphs Abs 02/05/2024 2.5  0.7 - 4.0 K/uL Final   Monocytes Relative 02/05/2024 9  % Final   Monocytes Absolute 02/05/2024 0.5  0.1 - 1.0 K/uL Final   Eosinophils Relative 02/05/2024 4  % Final   Eosinophils Absolute 02/05/2024 0.2  0.0 - 0.5 K/uL Final   Basophils Relative 02/05/2024 1  % Final   Basophils Absolute 02/05/2024 0.1  0.0 - 0.1 K/uL Final   Immature Granulocytes 02/05/2024 0  % Final   Abs Immature Granulocytes 02/05/2024 0.02  0.00 - 0.07 K/uL Final   Performed at [facility], [address], [place], [place] [address]   Sodium 02/05/2024 138  135 - 145 mmol/L Final   Potassium 02/05/2024 5.0  3.5 - 5.1 mmol/L Final   Chloride 02/05/2024 103  98 - 111 mmol/L Final   CO2 02/05/2024 26  22 - 32 mmol/L Final   Glucose, Bld 02/05/2024 76  70 - 99 mg/dL Final   Glucose reference range applies only  to samples taken after fasting for at least 8 hours.   BUN 02/05/2024 8  6 - 20 mg/dL Final   Creatinine, Ser 02/05/2024 0.98  0.61 - 1.24 mg/dL Final   Calcium 91/88/7974 9.1  8.9 - 10.3 mg/dL Final   Total Protein 91/88/7974 6.9  6.5 - 8.1 g/dL Final   Albumin 91/88/7974 3.4 (L)  3.5 - 5.0 g/dL Final   AST 91/88/7974 34  15 - 41 U/L Final   ALT 02/05/2024 41  0 - 44 U/L Final   Alkaline Phosphatase 02/05/2024 91  38 - 126 U/L Final   Total Bilirubin 02/05/2024 1.1  0.0 - 1.2 mg/dL Final   GFR, Estimated 02/05/2024 >60  >60 mL/min Final   Comment: (NOTE) Calculated using the CKD-EPI Creatinine Equation (2021)    Anion gap 02/05/2024 9  5 - 15 Final   Performed at [facility], [address] [place], [place] [address]   Hgb A1c MFr Bld 02/05/2024 5.4  4.8 - 5.6 % Final   Comment: (NOTE)         Prediabetes: 5.7 - 6.4         Diabetes: >6.4         Glycemic control for adults with diabetes: <7.0    Mean Plasma Glucose 02/05/2024 108  mg/dL Final   Comment: (NOTE) Performed At: [facility] [address] [place], [place] [id number] Jennette Shorter MD Ey:[id number]    Alcohol, Ethyl (B) 02/05/2024 <15  <15 mg/dL Final   Comment: (NOTE) For medical purposes only. Performed at [facility], [address] [place], [place] [address]    Cholesterol 02/05/2024 206 (H)  0 - 200 mg/dL Final   Triglycerides 91/88/7974 133  <150 mg/dL Final   HDL 91/88/7974 90  >40 mg/dL Final   Total CHOL/HDL Ratio 02/05/2024 2.3  RATIO Final   VLDL 02/05/2024 27  0 - 40 mg/dL Final   LDL Cholesterol 02/05/2024 89  0 - 99 mg/dL Final   Comment:        Total Cholesterol/HDL:CHD Risk Coronary Heart Disease Risk Table                     Men   Women  1/2 Average Risk   3.4   3.3  Average  Risk       5.0   4.4  2 X Average Risk   9.6   7.1  3 X Average Risk  23.4   11.0        Use the calculated Patient Ratio above and the CHD Risk Table to determine the patient's CHD Risk.         ATP III CLASSIFICATION (LDL):  <100     mg/dL   Optimal  899-870  mg/dL   Near or Above                    Optimal  130-159  mg/dL   Borderline  839-810  mg/dL   High  >809     mg/dL   Very High Performed at [facility], [address] [place], [place] [address]    TSH 02/05/2024 0.692  0.350 - 4.500 uIU/mL Final   Comment: Performed by a 3rd Generation assay with a functional sensitivity of <=0.01 uIU/mL. Performed at [facility], [address] [place], [place] [address]    POC Amphetamine UR 02/05/2024 None Detected  NONE DETECTED (Cut Off Level 1000 ng/mL) Final   POC Secobarbital (BAR) 02/05/2024 None Detected  NONE DETECTED (Cut Off Level 300 ng/mL) Final   POC Buprenorphine (BUP) 02/05/2024 None Detected  NONE DETECTED (Cut Off Level 10 ng/mL) Final   POC Oxazepam (BZO) 02/05/2024 None Detected  NONE DETECTED (Cut Off Level 300 ng/mL) Final   POC Cocaine UR 02/05/2024 Positive (A)  NONE DETECTED (Cut Off Level 300 ng/mL) Final   POC Methamphetamine UR 02/05/2024 Positive (A)  NONE DETECTED (Cut Off Level 1000 ng/mL) Final   POC Morphine 02/05/2024 None Detected  NONE DETECTED (Cut Off Level 300 ng/mL) Final   POC Methadone UR 02/05/2024 None Detected  NONE DETECTED (Cut Off Level 300 ng/mL) Final   POC Oxycodone UR 02/05/2024 Positive (A)  NONE DETECTED (Cut Off Level 100 ng/mL) Final   POC Marijuana UR 02/05/2024 Positive (A)  NONE DETECTED (Cut Off Level 50 ng/mL) Final    Blood Alcohol level:  Lab Results  Component Value Date   ETH <15 02/05/2024   ETH 94 (H) 07/09/2022    Metabolic Disorder Labs: Lab Results  Component Value Date   HGBA1C 5.4 02/05/2024   MPG 108 02/05/2024   No results found for: PROLACTIN Lab Results  Component Value Date   CHOL 206 (H) 02/05/2024   TRIG 133 02/05/2024   HDL 90 02/05/2024   CHOLHDL 2.3 02/05/2024   VLDL 27 02/05/2024   LDLCALC 89 02/05/2024   LDLCALC 106 (H) 07/09/2022    Therapeutic Lab  Levels: No results found for: LITHIUM No results found for: VALPROATE No results found for: CBMZ  Physical Findings   PHQ2-9    Flowsheet Row ED from 02/05/2024 in [facility] ED from 07/09/2022 in [facility]  PHQ-2 Total Score 4 2  PHQ-9 Total Score 22 8   Flowsheet Row ED from 02/05/2024 in [facility] Most recent reading at 02/05/2024 11:36 PM ED from 02/05/2024 in [facility] Most recent reading at 02/05/2024 10:17 PM ED from 07/09/2022 in [facility] Most recent reading at 07/13/2022  9:52 AM  C-SSRS RISK CATEGORY No Risk No Risk No Risk     Musculoskeletal  Strength & Muscle Tone: within normal limits Gait & Station: normal Patient leans: N/A  Psychiatric  Specialty Exam  Presentation  General Appearance:  Casual  Eye Contact: Fair  Speech: Normal Rate  Speech Volume: Normal  Handedness: Right   Mood and Affect  Mood: Euthymic  Affect: Blunt   Thought Process  Thought Processes: Linear  Descriptions of Associations:Intact  Orientation:Full (Time, Place and Person)  Thought Content:Logical  Diagnosis of Schizophrenia or Schizoaffective disorder in past: No data recorded   Hallucinations:Hallucinations: None  Ideas of Reference:None  Suicidal Thoughts:Suicidal Thoughts: No  Homicidal Thoughts:Homicidal Thoughts: No   Sensorium  Memory: Immediate Good; Recent Fair  Judgment: Fair  Insight: Fair   Art therapist  Concentration: Fair  Attention Span: Fair  Recall: Good  Fund of Knowledge: Good  Language: Good   Psychomotor Activity  Psychomotor Activity: Psychomotor Activity: Normal   Assets  Assets: Communication Skills; Desire for Improvement; Leisure Time   Sleep  Sleep: Sleep: Good  Estimated Sleeping Duration (Last 24 Hours): 6.50-8.00 hours  No data  recorded  Physical Exam  Physical Exam ROS Blood pressure 105/77, pulse 67, temperature 98.3 F (36.8 C), resp. rate 16, SpO2 99%. There is no height or weight on file to calculate BMI.  Treatment Plan Summary: Short Term Goals: Patient will verbalize feelings in meetings with treatment team members., Patient will attend at least of 50% of the groups daily., Pt will complete the PHQ9 on admission, day 3 and discharge., Patient will participate in completing the [place] Suicide Severity Rating Scale, Patient will score a low risk of violence for 24 hours prior to discharge, and Patient will take medications as prescribed daily. Long Term Goals: Improvement in symptoms so as ready for discharge Monitor response to sertraline . Refer patient for residential substance treatment.   Transfer to residential when treatment facility located and accepted Continue CIWA protocol with Librium  PRN Continue COWS protocol Continue Zoloft  50 mg once daily for MDD Continue nicotine  gum   Corean Anette Potters, MD 02/11/2024 12:47 PM

## 2024-02-11 NOTE — Group Note (Signed)
 Group Topic: Healthy Self Image and Positive Change  Group Date: 02/11/2024 Start Time: 1200 End Time: 1230 Facilitators: Herold Lajuana NOVAK, RN  Department: [facility]  Number of Participants: 7  Group Focus: coping skills Treatment Modality:  Individual Therapy Interventions utilized were leisure development and problem solving Purpose: relapse prevention strategies  Name: Juan Hood Date of Birth: 11/09/1975  MR: [id number]    Level of Participation: active Quality of Participation: attentive Interactions with others: gave feedback Mood/Affect: appropriate Triggers (if applicable): none identified Cognition: coherent/clear Progress: Significant Response: when I think about using, I like to read something. That takes my  mind off of using Plan: patient will be encouraged to utilize learned coping skills to manage cravings  Patients Problems:  Patient Active Problem List   Diagnosis Date Noted   Polysubstance abuse (HCC) 02/05/2024   GAD (generalized anxiety disorder) 09/26/2022   Cocaine use disorder (HCC) 07/09/2022   Cocaine abuse (HCC) 12/08/2019   Alcohol abuse 12/08/2019   Tobacco abuse 12/08/2019   Acute hypoxic Respiratory failure (HCC) 12/08/2019

## 2024-02-12 DIAGNOSIS — F1910 Other psychoactive substance abuse, uncomplicated: Secondary | ICD-10-CM | POA: Diagnosis not present

## 2024-02-12 DIAGNOSIS — F1190 Opioid use, unspecified, uncomplicated: Secondary | ICD-10-CM | POA: Diagnosis not present

## 2024-02-12 DIAGNOSIS — F1090 Alcohol use, unspecified, uncomplicated: Secondary | ICD-10-CM | POA: Diagnosis not present

## 2024-02-12 DIAGNOSIS — F1490 Cocaine use, unspecified, uncomplicated: Secondary | ICD-10-CM | POA: Diagnosis not present

## 2024-02-12 MED ORDER — TRAZODONE HCL 100 MG PO TABS: 100.0000 mg | ORAL_TABLET | Freq: Every evening | ORAL | 0 refills | Status: AC | PRN

## 2024-02-12 MED ORDER — SERTRALINE HCL 50 MG PO TABS: 50.0000 mg | ORAL_TABLET | Freq: Every day | ORAL | 9 refills | Status: AC

## 2024-02-12 MED ORDER — NALTREXONE HCL 50 MG PO TABS
50.0000 mg | ORAL_TABLET | Freq: Every day | ORAL | Status: DC
  Administered 2024-02-12: 50 mg via ORAL
  Filled 2024-02-12: qty 14
  Filled 2024-02-12: qty 1

## 2024-02-12 MED ORDER — NALTREXONE HCL 50 MG PO TABS: 50.0000 mg | ORAL_TABLET | Freq: Every day | ORAL | 0 refills | Status: AC

## 2024-02-12 MED ORDER — NICOTINE POLACRILEX 2 MG MT GUM: 2.0000 mg | CHEWING_GUM | OROMUCOSAL | 0 refills | Status: DC | PRN

## 2024-02-12 NOTE — Group Note (Signed)
 Group Topic: Healthy Self Image and Positive Change  Group Date: 02/12/2024 Start Time: 2045 End Time: 2123 Facilitators: Joan Plowman B  Department: [facility]  Number of Participants: 6 Group Focus: abuse issues, activities of daily living skills, communication, and daily focus Treatment Modality:  Leisure Development and Spiritual Interventions utilized were leisure development Purpose: express feelings, relapse prevention strategies, and trigger / craving management  Name: Juan Hood Date of Birth: 12-08-1975  MR: [id number]    Level of Participation: active Quality of Participation: active Interactions with others: gave feedback Mood/Affect: appropriate, brightens with interaction, and positive Triggers (if applicable): NA Cognition: coherent/clear Progress: Gaining insight Response: NA Plan: patient will be encouraged to keep going to groups  Patients Problems:  Patient Active Problem List   Diagnosis Date Noted   Polysubstance abuse (HCC) 02/05/2024   GAD (generalized anxiety disorder) 09/26/2022   Cocaine use disorder (HCC) 07/09/2022   Cocaine abuse (HCC) 12/08/2019   Alcohol abuse 12/08/2019   Tobacco abuse 12/08/2019   Acute hypoxic Respiratory failure (HCC) 12/08/2019

## 2024-02-12 NOTE — ED Notes (Signed)
 Patient is sleeping and the milieu is so quiet.

## 2024-02-12 NOTE — Group Note (Signed)
 Group Topic: Change and Accountability  Group Date: 02/12/2024 Start Time: 0800 End Time: 0845 Facilitators: Lonzell Dwayne RAMAN, NT  Department: [facility]  Number of Participants: 4  Group Focus: personal responsibility Treatment Modality:  Psychoeducation Interventions utilized were patient education Purpose: express feelings  Name: Juan Hood Date of Birth: June 29, 1975  MR: [id number]    Level of Participation: Patient did not attend group Quality of Participation: N/A Interactions with others: N/A Mood/Affect: N/A Triggers (if applicable): N/A Cognition: N/A Progress: N/A Response: N/A Plan: N/A  Patients Problems:  Patient Active Problem List   Diagnosis Date Noted   Polysubstance abuse (HCC) 02/05/2024   GAD (generalized anxiety disorder) 09/26/2022   Cocaine use disorder (HCC) 07/09/2022   Cocaine abuse (HCC) 12/08/2019   Alcohol abuse 12/08/2019   Tobacco abuse 12/08/2019   Acute hypoxic Respiratory failure (HCC) 12/08/2019

## 2024-02-12 NOTE — ED Provider Notes (Signed)
 Behavioral Health Progress Note  Date and Time: 02/12/2024 12:22 PM Name: Juan Hood MRN:  [id number]  Subjective:  Juan Hood was seen in his room today. Reports fair sleep and appetite and improving mood and anxiety with Zoloft . Patient denies any withdrawal symptoms but reports alcohol cravings. Discussed r/b/a of starting Naltrexone  50 mg qdaily and patient is requesting trial. Patient denies SI, HI or AVH today. Denies wanting to be dead. Future oriented on discharge to [facility] residential rehab tomorrow.    Diagnosis:  Final diagnoses:  Polysubstance abuse (HCC)  Homelessness  Alcohol use disorder  Crack cocaine use  Opioid use disorder    Total Time spent with patient: 20 minutes  Past Psychiatric History: polysubstance use disorder (EtOH, cocaine), GAD Past Medical History: acute hypoxic respiratory failure (accidental overdose) Social History: homeless  Sleep: Good  Appetite:  Good  Current Medications:  Current Facility-Administered Medications  Medication Dose Route Frequency Provider Last Rate Last Admin   alum & mag hydroxide-simeth (MAALOX/MYLANTA) 200-200-20 MG/5ML suspension 30 mL  30 mL Oral Q4H PRN Onuoha, Chinwendu V, NP       haloperidol  (HALDOL ) tablet 5 mg  5 mg Oral TID PRN Onuoha, Chinwendu V, NP       And   diphenhydrAMINE  (BENADRYL ) capsule 50 mg  50 mg Oral TID PRN Onuoha, Chinwendu V, NP       haloperidol  lactate (HALDOL ) injection 5 mg  5 mg Intramuscular TID PRN Onuoha, Chinwendu V, NP       And   diphenhydrAMINE  (BENADRYL ) injection 50 mg  50 mg Intramuscular TID PRN Onuoha, Chinwendu V, NP       And   LORazepam  (ATIVAN ) injection 2 mg  2 mg Intramuscular TID PRN Onuoha, Chinwendu V, NP       haloperidol  lactate (HALDOL ) injection 10 mg  10 mg Intramuscular TID PRN Onuoha, Chinwendu V, NP       And   diphenhydrAMINE  (BENADRYL ) injection 50 mg  50 mg Intramuscular TID PRN Onuoha, Chinwendu V, NP       And   LORazepam  (ATIVAN ) injection 2 mg  2  mg Intramuscular TID PRN Onuoha, Chinwendu V, NP       magnesium  hydroxide (MILK OF MAGNESIA) suspension 30 mL  30 mL Oral Daily PRN Onuoha, Chinwendu V, NP       multivitamin with minerals tablet 1 tablet  1 tablet Oral Daily Onuoha, Chinwendu V, NP   1 tablet at 02/12/24 9065   nicotine  polacrilex (NICORETTE ) gum 2 mg  2 mg Oral PRN McCarty, Artie, MD       sertraline  (ZOLOFT ) tablet 50 mg  50 mg Oral Daily McCarty, Artie, MD   50 mg at 02/12/24 0934   traZODone  (DESYREL ) tablet 100 mg  100 mg Oral QHS PRN Tex Drilling, NP   100 mg at 02/11/24 2156   No current outpatient medications on file.    Labs  Lab Results:  Admission on 02/05/2024  Component Date Value Ref Range Status   WBC 02/05/2024 5.3  4.0 - 10.5 K/uL Final   RBC 02/05/2024 4.97  4.22 - 5.81 MIL/uL Final   Hemoglobin 02/05/2024 16.8  13.0 - 17.0 g/dL Final   HCT 91/88/7974 48.5  39.0 - 52.0 % Final   MCV 02/05/2024 97.6  80.0 - 100.0 fL Final   MCH 02/05/2024 33.8  26.0 - 34.0 pg Final   MCHC 02/05/2024 34.6  30.0 - 36.0 g/dL Final   RDW 91/88/7974 12.2  11.5 -  15.5 % Final   Platelets 02/05/2024 263  150 - 400 K/uL Final   nRBC 02/05/2024 0.0  0.0 - 0.2 % Final   Neutrophils Relative % 02/05/2024 38  % Final   Neutro Abs 02/05/2024 2.0  1.7 - 7.7 K/uL Final   Lymphocytes Relative 02/05/2024 48  % Final   Lymphs Abs 02/05/2024 2.5  0.7 - 4.0 K/uL Final   Monocytes Relative 02/05/2024 9  % Final   Monocytes Absolute 02/05/2024 0.5  0.1 - 1.0 K/uL Final   Eosinophils Relative 02/05/2024 4  % Final   Eosinophils Absolute 02/05/2024 0.2  0.0 - 0.5 K/uL Final   Basophils Relative 02/05/2024 1  % Final   Basophils Absolute 02/05/2024 0.1  0.0 - 0.1 K/uL Final   Immature Granulocytes 02/05/2024 0  % Final   Abs Immature Granulocytes 02/05/2024 0.02  0.00 - 0.07 K/uL Final   Performed at [facility], [address], [place], [place] [address]   Sodium 02/05/2024 138  135 - 145 mmol/L Final   Potassium 02/05/2024  5.0  3.5 - 5.1 mmol/L Final   Chloride 02/05/2024 103  98 - 111 mmol/L Final   CO2 02/05/2024 26  22 - 32 mmol/L Final   Glucose, Bld 02/05/2024 76  70 - 99 mg/dL Final   Glucose reference range applies only to samples taken after fasting for at least 8 hours.   BUN 02/05/2024 8  6 - 20 mg/dL Final   Creatinine, Ser 02/05/2024 0.98  0.61 - 1.24 mg/dL Final   Calcium 91/88/7974 9.1  8.9 - 10.3 mg/dL Final   Total Protein 91/88/7974 6.9  6.5 - 8.1 g/dL Final   Albumin 91/88/7974 3.4 (L)  3.5 - 5.0 g/dL Final   AST 91/88/7974 34  15 - 41 U/L Final   ALT 02/05/2024 41  0 - 44 U/L Final   Alkaline Phosphatase 02/05/2024 91  38 - 126 U/L Final   Total Bilirubin 02/05/2024 1.1  0.0 - 1.2 mg/dL Final   GFR, Estimated 02/05/2024 >60  >60 mL/min Final   Comment: (NOTE) Calculated using the CKD-EPI Creatinine Equation (2021)    Anion gap 02/05/2024 9  5 - 15 Final   Performed at [facility], [address], [place], [place] [address]   Hgb A1c MFr Bld 02/05/2024 5.4  4.8 - 5.6 % Final   Comment: (NOTE)         Prediabetes: 5.7 - 6.4         Diabetes: >6.4         Glycemic control for adults with diabetes: <7.0    Mean Plasma Glucose 02/05/2024 108  mg/dL Final   Comment: (NOTE) Performed At: [facility] [address] [place], [place] [id number] Jennette Shorter MD Ey:[id number]    Alcohol, Ethyl (B) 02/05/2024 <15  <15 mg/dL Final   Comment: (NOTE) For medical purposes only. Performed at [facility], [address] [place], [place] [address]    Cholesterol 02/05/2024 206 (H)  0 - 200 mg/dL Final   Triglycerides 91/88/7974 133  <150 mg/dL Final   HDL 91/88/7974 90  >40 mg/dL Final   Total CHOL/HDL Ratio 02/05/2024 2.3  RATIO Final   VLDL 02/05/2024 27  0 - 40 mg/dL Final   LDL Cholesterol 02/05/2024 89  0 - 99 mg/dL Final   Comment:        Total Cholesterol/HDL:CHD Risk Coronary Heart Disease Risk Table  Men   Women  1/2 Average Risk   3.4    3.3  Average Risk       5.0   4.4  2 X Average Risk   9.6   7.1  3 X Average Risk  23.4   11.0        Use the calculated Patient Ratio above and the CHD Risk Table to determine the patient's CHD Risk.        ATP III CLASSIFICATION (LDL):  <100     mg/dL   Optimal  899-870  mg/dL   Near or Above                    Optimal  130-159  mg/dL   Borderline  839-810  mg/dL   High  >809     mg/dL   Very High Performed at [facility], [address], [place], [place] [address]    TSH 02/05/2024 0.692  0.350 - 4.500 uIU/mL Final   Comment: Performed by a 3rd Generation assay with a functional sensitivity of <=0.01 uIU/mL. Performed at [facility], [address] [place], [place] [address]    POC Amphetamine UR 02/05/2024 None Detected  NONE DETECTED (Cut Off Level 1000 ng/mL) Final   POC Secobarbital (BAR) 02/05/2024 None Detected  NONE DETECTED (Cut Off Level 300 ng/mL) Final   POC Buprenorphine (BUP) 02/05/2024 None Detected  NONE DETECTED (Cut Off Level 10 ng/mL) Final   POC Oxazepam (BZO) 02/05/2024 None Detected  NONE DETECTED (Cut Off Level 300 ng/mL) Final   POC Cocaine UR 02/05/2024 Positive (A)  NONE DETECTED (Cut Off Level 300 ng/mL) Final   POC Methamphetamine UR 02/05/2024 Positive (A)  NONE DETECTED (Cut Off Level 1000 ng/mL) Final   POC Morphine 02/05/2024 None Detected  NONE DETECTED (Cut Off Level 300 ng/mL) Final   POC Methadone UR 02/05/2024 None Detected  NONE DETECTED (Cut Off Level 300 ng/mL) Final   POC Oxycodone UR 02/05/2024 Positive (A)  NONE DETECTED (Cut Off Level 100 ng/mL) Final   POC Marijuana UR 02/05/2024 Positive (A)  NONE DETECTED (Cut Off Level 50 ng/mL) Final    Blood Alcohol level:  Lab Results  Component Value Date   ETH <15 02/05/2024   ETH 94 (H) 07/09/2022    Metabolic Disorder Labs: Lab Results  Component Value Date   HGBA1C 5.4 02/05/2024   MPG 108 02/05/2024   No results found for: PROLACTIN Lab Results  Component  Value Date   CHOL 206 (H) 02/05/2024   TRIG 133 02/05/2024   HDL 90 02/05/2024   CHOLHDL 2.3 02/05/2024   VLDL 27 02/05/2024   LDLCALC 89 02/05/2024   LDLCALC 106 (H) 07/09/2022    Therapeutic Lab Levels: No results found for: LITHIUM No results found for: VALPROATE No results found for: CBMZ  Physical Findings   PHQ2-9    Flowsheet Row ED from 02/05/2024 in [facility] ED from 07/09/2022 in [facility]  PHQ-2 Total Score 4 2  PHQ-9 Total Score 22 8   Flowsheet Row ED from 02/05/2024 in [facility] Most recent reading at 02/05/2024 11:36 PM ED from 02/05/2024 in [facility] Most recent reading at 02/05/2024 10:17 PM ED from 07/09/2022 in [facility] Most recent reading at 07/13/2022  9:52 AM  C-SSRS RISK CATEGORY No Risk No Risk No Risk     Musculoskeletal  Strength & Muscle Tone: within normal limits Gait & Station: normal Patient leans: N/A  Psychiatric Specialty Exam  Presentation  General Appearance:  Casual  Eye Contact: Fair  Speech: Normal Rate  Speech Volume: Normal  Handedness: Right   Mood and Affect  Mood: Euthymic  Affect: Blunt   Thought Process  Thought Processes: Linear  Descriptions of Associations:Intact  Orientation:Full (Time, Place and Person)  Thought Content:Logical  Diagnosis of Schizophrenia or Schizoaffective disorder in past: No data recorded   Hallucinations:Hallucinations: None  Ideas of Reference:None  Suicidal Thoughts:Suicidal Thoughts: No  Homicidal Thoughts:Homicidal Thoughts: No   Sensorium  Memory: Immediate Good; Recent Fair  Judgment: Fair  Insight: Fair   Art therapist  Concentration: Fair  Attention Span: Fair  Recall: Good  Fund of Knowledge: Good  Language: Good   Psychomotor Activity  Psychomotor Activity: Psychomotor  Activity: Normal   Assets  Assets: Communication Skills; Desire for Improvement; Leisure Time   Sleep  Sleep: Sleep: Good  Estimated Sleeping Duration (Last 24 Hours): 9.25-11.50 hours  No data recorded  Physical Exam  Physical Exam ROS Blood pressure 111/77, pulse 92, temperature 97.9 F (36.6 C), temperature source Oral, resp. rate 18, SpO2 100%. There is no height or weight on file to calculate BMI.  Assessment: Patient denies SI, HI or AVH today. Requesting trial of naltrexone  for alcohol cessation and cravings after discussion or r/b/a. Future oriented on discharge to [facility] residential rehab tomorrow.    Treatment Plan Summary: Short Term Goals: Patient will verbalize feelings in meetings with treatment team members., Patient will attend at least of 50% of the groups daily., Pt will complete the PHQ9 on admission, day 3 and discharge., Patient will participate in completing the [place] Suicide Severity Rating Scale, Patient will score a low risk of violence for 24 hours prior to discharge, and Patient will take medications as prescribed daily. Long Term Goals: Improvement in symptoms so as ready for discharge Monitor response to sertraline . Refer patient for residential substance treatment.   Transfer to residential when treatment facility located and accepted Continue CIWA protocol with Librium  PRN Continue COWS protocol Start naltrexone  50 mg qdaily for alcohol dependence Continue Zoloft  50 mg once daily for MDD Continue nicotine  gum   Ekin Pilar, MD 02/12/2024 12:22 PM

## 2024-02-12 NOTE — ED Notes (Signed)
 Patient is sleeping. Respirations equal and unlabored. No change in assessment or acuity. Routine safety checks conducted according to facility protocol.

## 2024-02-12 NOTE — ED Notes (Signed)
 Patient went to bathroom now back to his bed room.

## 2024-02-12 NOTE — ED Notes (Signed)
 Pt is in the dayroom watching TV with peers. Pt denies SI/HI/AVH. Pt has no further complain.No acute distress noted.

## 2024-02-12 NOTE — ED Notes (Signed)
 Pt presents sitting in dayroom reading.  Calm upon approach Q 15 minute observations continue

## 2024-02-12 NOTE — ED Notes (Signed)
 Pt OOB.  Group attendance noted.  Calm and cooperative  No need Identified at present. Q 15 minute observations for safety continue.

## 2024-02-12 NOTE — Discharge Instructions (Signed)
 Patient will be discharging to [facility] on Tuesday 02/13/24 by 9:00am with transportation provided via [employer] with pick up scheduled for 8:15. Address is [address] [place], [place] [address]. Number to call for emergency is [phone] Patient will be provided with 14 day supply of medications and 30 day scripts.

## 2024-02-12 NOTE — ED Notes (Signed)
 Pt has been calm and cooperative.  Denied withdrawal symptoms.  Denied SI/HI and A/V hallucinations. Q 15 minute observations for safety continue

## 2024-02-13 DIAGNOSIS — F1490 Cocaine use, unspecified, uncomplicated: Secondary | ICD-10-CM | POA: Diagnosis not present

## 2024-02-13 DIAGNOSIS — F1910 Other psychoactive substance abuse, uncomplicated: Secondary | ICD-10-CM | POA: Diagnosis not present

## 2024-02-13 DIAGNOSIS — F1190 Opioid use, unspecified, uncomplicated: Secondary | ICD-10-CM | POA: Diagnosis not present

## 2024-02-13 DIAGNOSIS — F1090 Alcohol use, unspecified, uncomplicated: Secondary | ICD-10-CM | POA: Diagnosis not present

## 2024-02-13 MED ORDER — NICOTINE 14 MG/24HR TD PT24
14.0000 mg | MEDICATED_PATCH | Freq: Every day | TRANSDERMAL | Status: DC
  Filled 2024-02-13: qty 14

## 2024-02-13 MED ORDER — NICOTINE 14 MG/24HR TD PT24: 14.0000 mg | MEDICATED_PATCH | TRANSDERMAL | 0 refills | Status: AC

## 2024-02-13 NOTE — ED Provider Notes (Signed)
 FBC/OBS ASAP Discharge Summary  Date and Time: 02/13/2024 9:04 AM  Name: Juan Hood  MRN:  [id number]   Discharge Diagnoses:  Final diagnoses:  Polysubstance abuse (HCC)  Homelessness  Alcohol use disorder  Crack cocaine use  Opioid use disorder  MDD, moderate recurrent  Subjective: [name] was seen in his room today. Reports good mood and denies alcohol cravings. He is future oriented on discharge to [facility] for rehab today. Reports fair sleep and appetite and improving mood and anxiety with Zoloft . Patient denies any withdrawal symptoms but reports alcohol cravings. Denies side effects from naltrexone . Patient denies SI, HI or AVH today. Denies wanting to be dead.  Stay Summary:  Patient was admitted to inpatient psychiatry at [facility] for safety and stabilization. Patient was provided safe and therapeutic milieu, psychiatric and medical assessment, care and treatment, as well as support from nursing, behavioral health staff. Both psychotherapy and psychoeducation groups were provided. Different coping skills such as journaling, CBT and art therapy groups were offered. Additional consultation was provided by hospitalist for H&P and medical needs.  Patient was started on Zoloft  and naltrexone  during the admission for MDD and alcohol use disorder. Patient tolerated without side effects and medications were titrated to therapeutic effect. As patient stabilized on medications and participated in therapeutic interventions, symptoms began to improve.  On the day of discharge, the chart was reviewed, case was discussed with staff and the patient was seen in person. Patient's overall mood has improved. Patient was calm and cooperative and did not appear anxious. Patient reported adequate sleep and stable mood. Patient was tolerating medications well without side effects reported or noted. Patient denied suicidal ideation, plan or intent, denied hopelessness, helplessness or worthlessness,  and denied homicidal ideation. Insight and judgement have improved. Patient demonstrated future orientation and was motivated to follow-up with aftercare. Patient was encouraged to be adherent with medications. Patient was instructed to call 911, ask for help to go to the closest emergency room or crisis center, call crisis hotlines for help if in critical status or when symptoms were worsening. Patient voiced understanding of this information. At the time of discharge, patient had reached maximum benefit from hospitalization, was no longer considered to be dangerous to self or others, and was psychiatrically stable and otherwise appropriate for discharge to less restrictive care in the community.   Medical Hospital Course: Patient was seen by the hospitalist for routine admission examination. Medications for chronic conditions were continued.  Medical hospital course was otherwise unremarkable.    Total Time spent with patient: 30 minutes  Past Psychiatric History: see H&P Past Medical History:  Past Medical History:  Diagnosis Date   Substance abuse (HCC)    Family Psychiatric History: denies history of suicide or mental health disorders Social History: homeless, unemployed Tobacco Cessation:  A prescription for an FDA-approved tobacco cessation medication provided at discharge  Current Medications:  Current Facility-Administered Medications  Medication Dose Route Frequency Provider Last Rate Last Admin   alum & mag hydroxide-simeth (MAALOX/MYLANTA) 200-200-20 MG/5ML suspension 30 mL  30 mL Oral Q4H PRN Onuoha, Chinwendu V, NP       haloperidol  (HALDOL ) tablet 5 mg  5 mg Oral TID PRN Onuoha, Chinwendu V, NP       And   diphenhydrAMINE  (BENADRYL ) capsule 50 mg  50 mg Oral TID PRN Onuoha, Chinwendu V, NP       haloperidol  lactate (HALDOL ) injection 5 mg  5 mg Intramuscular TID PRN Onuoha, Chinwendu V, NP  And   diphenhydrAMINE  (BENADRYL ) injection 50 mg  50 mg Intramuscular TID PRN  Onuoha, Chinwendu V, NP       And   LORazepam  (ATIVAN ) injection 2 mg  2 mg Intramuscular TID PRN Onuoha, Chinwendu V, NP       haloperidol  lactate (HALDOL ) injection 10 mg  10 mg Intramuscular TID PRN Onuoha, Chinwendu V, NP       And   diphenhydrAMINE  (BENADRYL ) injection 50 mg  50 mg Intramuscular TID PRN Onuoha, Chinwendu V, NP       And   LORazepam  (ATIVAN ) injection 2 mg  2 mg Intramuscular TID PRN Onuoha, Chinwendu V, NP       magnesium  hydroxide (MILK OF MAGNESIA) suspension 30 mL  30 mL Oral Daily PRN Onuoha, Chinwendu V, NP       multivitamin with minerals tablet 1 tablet  1 tablet Oral Daily Onuoha, Chinwendu V, NP   1 tablet at 02/12/24 0934   naltrexone  (DEPADE) tablet 50 mg  50 mg Oral Daily Kearsten Ginther, MD   50 mg at 02/12/24 1336   nicotine  (NICODERM CQ  - dosed in mg/24 hours) patch 14 mg  14 mg Transdermal Daily Green, Terri L, RPH       sertraline  (ZOLOFT ) tablet 50 mg  50 mg Oral Daily McCarty, Artie, MD   50 mg at 02/12/24 9065   traZODone  (DESYREL ) tablet 100 mg  100 mg Oral QHS PRN Tex Drilling, NP   100 mg at 02/12/24 2122   Current Outpatient Medications  Medication Sig Dispense Refill   nicotine  (NICODERM CQ  - DOSED IN MG/24 HOURS) 14 mg/24hr patch Place 1 patch (14 mg total) onto the skin daily. 30 patch 0   naltrexone  (DEPADE) 50 MG tablet Take 1 tablet (50 mg total) by mouth daily. 30 tablet 0   sertraline  (ZOLOFT ) 50 MG tablet Take 1 tablet (50 mg total) by mouth daily. 30 tablet 9   traZODone  (DESYREL ) 100 MG tablet Take 1 tablet (100 mg total) by mouth at bedtime as needed for sleep. 30 tablet 0    PTA Medications:  Facility Ordered Medications  Medication   alum & mag hydroxide-simeth (MAALOX/MYLANTA) 200-200-20 MG/5ML suspension 30 mL   magnesium  hydroxide (MILK OF MAGNESIA) suspension 30 mL   [COMPLETED] thiamine  (VITAMIN B1) injection 100 mg   multivitamin with minerals tablet 1 tablet   [EXPIRED] chlordiazePOXIDE  (LIBRIUM ) capsule 25 mg    haloperidol  (HALDOL ) tablet 5 mg   And   diphenhydrAMINE  (BENADRYL ) capsule 50 mg   haloperidol  lactate (HALDOL ) injection 5 mg   And   diphenhydrAMINE  (BENADRYL ) injection 50 mg   And   LORazepam  (ATIVAN ) injection 2 mg   haloperidol  lactate (HALDOL ) injection 10 mg   And   diphenhydrAMINE  (BENADRYL ) injection 50 mg   And   LORazepam  (ATIVAN ) injection 2 mg   [EXPIRED] dicyclomine  (BENTYL ) tablet 20 mg   [EXPIRED] hydrOXYzine  (ATARAX ) tablet 25 mg   [EXPIRED] loperamide  (IMODIUM ) capsule 2-4 mg   [EXPIRED] methocarbamol  (ROBAXIN ) tablet 500 mg   [EXPIRED] naproxen  (NAPROSYN ) tablet 500 mg   [EXPIRED] ondansetron  (ZOFRAN -ODT) disintegrating tablet 4 mg   sertraline  (ZOLOFT ) tablet 50 mg   traZODone  (DESYREL ) tablet 100 mg   naltrexone  (DEPADE) tablet 50 mg   nicotine  (NICODERM CQ  - dosed in mg/24 hours) patch 14 mg   PTA Medications  Medication Sig   nicotine  (NICODERM CQ  - DOSED IN MG/24 HOURS) 14 mg/24hr patch Place 1 patch (14 mg total) onto the skin  daily.   traZODone  (DESYREL ) 100 MG tablet Take 1 tablet (100 mg total) by mouth at bedtime as needed for sleep.   naltrexone  (DEPADE) 50 MG tablet Take 1 tablet (50 mg total) by mouth daily.   sertraline  (ZOLOFT ) 50 MG tablet Take 1 tablet (50 mg total) by mouth daily.       02/13/2024    9:04 AM 02/13/2024    8:06 AM 02/05/2024    9:36 PM  Depression screen PHQ 2/9  Decreased Interest 0 2 1  Down, Depressed, Hopeless 0 2 3  PHQ - 2 Score 0 4 4  Altered sleeping 0 2 3  Tired, decreased energy 0 2 3  Change in appetite 0 2 3  Feeling bad or failure about yourself  0 2 3  Trouble concentrating 0 2 3  Moving slowly or fidgety/restless 0 2 3  Suicidal thoughts 0 0 0  PHQ-9 Score 0 16 22  Difficult doing work/chores  Very difficult Very difficult    Flowsheet Row ED from 02/05/2024 in [facility] Most recent reading at 02/13/2024  8:28 AM ED from 02/05/2024 in [facility] Most recent reading at 02/05/2024 10:17 PM ED from 07/09/2022 in [facility] Most recent reading at 07/13/2022  9:52 AM  C-SSRS RISK CATEGORY No Risk No Risk No Risk    Musculoskeletal  Strength & Muscle Tone: within normal limits Gait & Station: normal Patient leans: N/A  Psychiatric Specialty Exam  Presentation  General Appearance:  Appropriate for Environment  Eye Contact: Fair  Speech: Clear and Coherent  Speech Volume: Normal  Handedness: Right   Mood and Affect  Mood: Euthymic  Affect: Congruent   Thought Process  Thought Processes: Coherent  Descriptions of Associations:Intact  Orientation:Full (Time, Place and Person)  Thought Content:Logical  Diagnosis of Schizophrenia or Schizoaffective disorder in past: No data recorded   Hallucinations:Hallucinations: None  Ideas of Reference:None  Suicidal Thoughts:Suicidal Thoughts: No  Homicidal Thoughts:Homicidal Thoughts: No   Sensorium  Memory: Immediate Fair  Judgment: Fair  Insight: Fair   Chartered certified accountant: Fair  Attention Span: Fair  Recall: [employer] of Knowledge: Fair  Language: Fair   Psychomotor Activity  Psychomotor Activity: Psychomotor Activity: Normal   Assets  Assets: Communication Skills; Desire for Improvement; Leisure Time; Health and safety inspector; Social Support   Sleep  Sleep: Sleep: Fair  Estimated Sleeping Duration (Last 24 Hours): 9.00-10.75 hours  No data recorded  Physical Exam   General: Well developed, well nourished.  Pupils: Normal at 3mm Respiratory: Breathing is unlabored.  Cardiovascular: No edema.  Language: No anomia, no aphasia Muscle strength and tone-pt moving all extremities.  Gait not assessed as pt remained in bed.  Neuro: Facial muscles are symmetric. Pt without tremor, no evidence of hyperarousal.  Review of Systems  Constitutional: Negative.   HENT:  Negative.    Eyes: Negative.   Respiratory: Negative.    Cardiovascular: Negative.   Gastrointestinal: Negative.   Genitourinary: Negative.   Musculoskeletal: Negative.   Skin: Negative.   Neurological: Negative.   Endo/Heme/Allergies: Negative.   Psychiatric/Behavioral: Negative.     Blood pressure 118/78, pulse 88, temperature 98.7 F (37.1 C), temperature source Oral, resp. rate 18, SpO2 100%. There is no height or weight on file to calculate BMI.  Demographic Factors:  Male, Low socioeconomic status, and Unemployed  Loss Factors: Financial problems/change in socioeconomic status  Historical Factors: Impulsivity  Risk Reduction Factors:  Sense of responsibility to family, Positive social support, Positive therapeutic relationship, and Positive coping skills or problem solving skills  Continued Clinical Symptoms:  Alcohol/Substance Abuse/Dependencies  Cognitive Features That Contribute To Risk:  None    Suicide Risk:  Minimal: No identifiable suicidal ideation.  Patients presenting with no risk factors but with morbid ruminations; may be classified as minimal risk based on the severity of the depressive symptoms  Patient denies SI or HI for >48 hours. Denies wanting to be dead and future oriented. SRA complete and acute risk for suicide is low.   [place] Suicide Risk assessment:  1. Do you wish to be dead? NONE REPORTED 2. Have you wished your dead or wished you could go to sleep and not wake up? NONE REPORTED 3.  Have you actually had thoughts of killing yourself?  NONE REPORTED 4.  Have you been thinking about how you might do this?  NONE REPORTED 5.  Have you had these thoughts and some intention of acting on them? NONE REPORTED 6.  Have you started to work out or worked out the details to kill yourself? NONE REPORTED 7.  Do you intend to carry out this plan? NONE REPORTED 8. On a scale of 1-5 with 1 being the least severe and 5 being the most severe answer the  following questions place for intensity of ideation. ZERO 9. How many times have you had these thoughts? NONE REPORTED 10. When you have the thoughts how long to the last?  NONE REPORTED 11. Control ability.  Could you or can you stop thinking about killing herself or wanting to die if you want to?  YES 12. Are there any things anyone or anything family religion pain of death that stop you from wanting to die or acting on thoughts of committing suicide?  FAMILY 13.  What sort of reason to do have to think about wanting to die or killing yourself? NONE REPORTED 14.Was it to end the pain or stop the way you are feeling in other words you could not go on living with his pain or how you are feeling or was not to get attention revenge or reaction from others?  Or both?  NONE REPORTED  [place] Suicide Risk assessment:  1. Do you wish to be dead? NONE REPORTED 2. Have you wished your dead or wished you could go to sleep and not wake up? NONE REPORTED 3.  Have you actually had thoughts of killing yourself?  NONE REPORTED 4.  Have you been thinking about how you might do this?  NONE REPORTED 5.  Have you had these thoughts and some intention of acting on them? NONE REPORTED 6.  Have you started to work out or worked out the details to kill yourself? NONE REPORTED 7.  Do you intend to carry out this plan? NONE REPORTED 8. On a scale of 1-5 with 1 being the least severe and 5 being the most severe answer the following questions place for intensity of ideation. ZERO 9. How many times have you had these thoughts? NONE REPORTED 10. When you have the thoughts how long to the last?  NONE REPORTED 11. Control ability.  Could you or can you stop thinking about killing herself or wanting to die if you want to?  YES 12. Are there any things anyone or anything family religion pain of death that stop you from wanting to die or acting on thoughts of committing suicide?  FAMILY 13.  What sort of reason  to do have to  think about wanting to die or killing yourself? NONE REPORTED 14.Was it to end the pain or stop the way you are feeling in other words you could not go on living with his pain or how you are feeling or was not to get attention revenge or reaction from others?  Or both?  NONE REPORTED   Plan Of Care/Follow-up recommendations:  Outpatient psychiatry  Disposition: [facility]  Altagracia Rone, MD 02/13/2024, 9:04 AM

## 2024-02-13 NOTE — ED Notes (Signed)
 Pt is sleeping. No acute distress noted.

## 2024-02-13 NOTE — ED Notes (Signed)
 Stable. A&O x 4.  Discharging to [facility] via [employer] taxi  Pt provided with  14 days of home medication and 30 days supply paper  script.   Denies current SI plan and Intent.  Denies HI and A/V hallucinations.   All belongings and valuables returned to patient.  F/U instruction reviewed.  Pt verbalized understanding

## 2024-02-13 NOTE — Group Note (Signed)
 Group Topic: Other  Group Date: 02/12/2024 Start Time: 0300 End Time: 0400 Facilitators: [place] Stephane HERO, [place]  Department: [facility]  Number of Participants: 5  Group Focus: chemical dependency education, chemical dependency issues, concentration, coping skills, and daily focus Treatment Modality:  Psychoeducation Interventions utilized were exploration, group exercise, mental fitness, and patient education Purpose: enhance coping skills, explore maladaptive thinking, improve communication skills, increase insight, and reinforce self-care  Name: Juan Hood Date of Birth: 08/04/1975  MR: [id number]    Level of Participation: moderate Quality of Participation: attentive, cooperative, and engaged Interactions with others: gave feedback Mood/Affect: appropriate and positive Triggers (if applicable): n/a Cognition: coherent/clear, insightful, and logical Progress: Significant Response: Provided some feedback to the group Plan: referral / recommendations are for [facility] residential treatment center for long term treatment  Patients Problems:  Patient Active Problem List   Diagnosis Date Noted   Polysubstance abuse (HCC) 02/05/2024   GAD (generalized anxiety disorder) 09/26/2022   Cocaine use disorder (HCC) 07/09/2022   Cocaine abuse (HCC) 12/08/2019   Alcohol abuse 12/08/2019   Tobacco abuse 12/08/2019   Acute hypoxic Respiratory failure (HCC) 12/08/2019

## 2024-06-12 ENCOUNTER — Emergency Department (HOSPITAL_BASED_OUTPATIENT_CLINIC_OR_DEPARTMENT_OTHER)
Admission: EM | Admit: 2024-06-12 | Discharge: 2024-06-12 | Disposition: A | Discharge status: Home / Self Care | Payer: MEDICAID | Source: Home / Self Care

## 2024-06-12 ENCOUNTER — Encounter (HOSPITAL_BASED_OUTPATIENT_CLINIC_OR_DEPARTMENT_OTHER): Payer: Self-pay | Admitting: Emergency Medicine

## 2024-06-12 ENCOUNTER — Other Ambulatory Visit: Payer: Self-pay

## 2024-06-12 DIAGNOSIS — H6691 Otitis media, unspecified, right ear: Secondary | ICD-10-CM | POA: Diagnosis not present

## 2024-06-12 DIAGNOSIS — K029 Dental caries, unspecified: Secondary | ICD-10-CM | POA: Diagnosis not present

## 2024-06-12 DIAGNOSIS — H6690 Otitis media, unspecified, unspecified ear: Secondary | ICD-10-CM

## 2024-06-12 DIAGNOSIS — K0889 Other specified disorders of teeth and supporting structures: Secondary | ICD-10-CM

## 2024-06-12 MED ORDER — AMOXICILLIN-POT CLAVULANATE 875-125 MG PO TABS: 1.0000 | ORAL_TABLET | Freq: Two times a day (BID) | ORAL | 0 refills | Status: AC

## 2024-06-12 MED ORDER — NAPROXEN 500 MG PO TABS: 500.0000 mg | ORAL_TABLET | Freq: Two times a day (BID) | ORAL | 0 refills | Status: AC

## 2024-06-12 NOTE — ED Provider Notes (Signed)
 [place] EMERGENCY DEPARTMENT AT [facility] Provider Note   CSN: [id number] Arrival date & time: 06/12/24  1011     Patient presents with: Dental Pain   Juan Hood is a 48 y.o. male.   This is a 48 year old male presenting emergency department with dental pain.  Reports symptoms started Monday evening/yesterday and worsened.  Also complaining of some ear pain this morning.  No tongue or lip swelling or difficulty swallowing.  Is able to hear.  No headaches vision loss fevers or chills.   Dental Pain      Prior to Admission medications  Medication Sig Start Date End Date Taking? Authorizing Provider  naltrexone  (DEPADE) 50 MG tablet Take 1 tablet (50 mg total) by mouth daily. 02/13/24   Zouev, Dmitri, MD  sertraline  (ZOLOFT ) 50 MG tablet Take 1 tablet (50 mg total) by mouth daily. 02/13/24   Zouev, Dmitri, MD  traZODone  (DESYREL ) 100 MG tablet Take 1 tablet (100 mg total) by mouth at bedtime as needed for sleep. 02/12/24   Zouev, Dmitri, MD    Allergies: Patient has no known allergies.    Review of Systems  Updated Vital Signs BP 104/69 (BP Location: Right Arm)   Pulse 72   Temp 98.4 F (36.9 C) (Oral)   Resp 14   Ht 5' 4 (1.626 m)   Wt 63.5 kg   SpO2 97%   BMI 24.03 kg/m   Physical Exam Vitals and nursing note reviewed.  HENT:     Ears:     Comments: Right TM with erythema and bulging    Mouth/Throat:     Mouth: Mucous membranes are moist.     Comments: Poor dentition.  No obvious periapical abscess.  No angioedema.  Uvula is midline.  No trismus or muffled voice. Eyes:     Conjunctiva/sclera: Conjunctivae normal.  Cardiovascular:     Rate and Rhythm: Normal rate.  Pulmonary:     Effort: Pulmonary effort is normal.  Skin:    General: Skin is warm.     Capillary Refill: Capillary refill takes less than 2 seconds.  Neurological:     Mental Status: He is alert and oriented to person, place, and time.  Psychiatric:        Mood and  Affect: Mood normal.        Behavior: Behavior normal.     (all labs ordered are listed, but only abnormal results are displayed) Labs Reviewed - No data to display  EKG: None  Radiology: No results found.   Procedures   Medications Ordered in the ED - No data to display                                  Medical Decision Making Well-appearing 48 year old male presenting emergency department with dental pain and ear pain.  He is afebrile nontachycardic, normotensive.  Physical exam with what appears to be otitis media.  Also has significant dental caries on that same right upper molar.  May be dental as well.  He is overall clinically well-appearing.  Maintaining secretions, I have low clinical suspicion based on physical exam of retropharyngeal or peritonsillar abscess.  Will need antibiotics.  Discharged with naproxen  as he is coming from [facility] recovery.  Will give resources to dentist.  Stable for discharge at this time.      Final diagnoses:  None    ED Discharge Orders  None          Neysa Caron PARAS, DO 06/12/24 1044

## 2024-06-12 NOTE — Discharge Instructions (Signed)
 He may take Tylenol  alternating with the naproxen  that we have prescribed for pain.  We are also prescribing antibiotic.  Please take it as prescribed for the full course even if symptoms improved.  We would like you to see a dentist as soon as possible.  If you do not have 1, you may use the resource guide that we have given you.  Return for fevers, chills, severe pain, tongue or lip swelling, difficulty swallowing, difficulty breathing, severe headache, neck stiffness, or he develop any new or worsening symptoms that are concerning to you.

## 2024-06-12 NOTE — ED Triage Notes (Signed)
 Mouth pain  rt sided started  Monday and his ears hurt same day  states  has bad tooth
